# Patient Record
Sex: Male | Born: 1940 | Race: White | Hispanic: No | Marital: Married | Smoking: Never smoker
Health system: Southern US, Community
[De-identification: ages and names within clinical notes are randomized; demographics above are authoritative.]

## PROBLEM LIST (undated history)

## (undated) DIAGNOSIS — E785 Hyperlipidemia, unspecified: Secondary | ICD-10-CM

## (undated) DIAGNOSIS — Q249 Congenital malformation of heart, unspecified: Secondary | ICD-10-CM

## (undated) DIAGNOSIS — C4491 Basal cell carcinoma of skin, unspecified: Secondary | ICD-10-CM

## (undated) DIAGNOSIS — K635 Polyp of colon: Secondary | ICD-10-CM

## (undated) DIAGNOSIS — Z8619 Personal history of other infectious and parasitic diseases: Secondary | ICD-10-CM

## (undated) DIAGNOSIS — R17 Unspecified jaundice: Secondary | ICD-10-CM

## (undated) HISTORY — DX: Congenital malformation of heart, unspecified: Q24.9

## (undated) HISTORY — DX: Unspecified jaundice: R17

## (undated) HISTORY — DX: Basal cell carcinoma of skin, unspecified: C44.91

## (undated) HISTORY — PX: HERNIA REPAIR: SHX51

## (undated) HISTORY — DX: Personal history of other infectious and parasitic diseases: Z86.19

## (undated) HISTORY — PX: TONSILLECTOMY: SUR1361

## (undated) HISTORY — PX: CHOLECYSTECTOMY: SHX55

## (undated) HISTORY — DX: Hyperlipidemia, unspecified: E78.5

## (undated) HISTORY — DX: Polyp of colon: K63.5

## (undated) HISTORY — PX: OTHER SURGICAL HISTORY: SHX169

---

## 2005-05-26 ENCOUNTER — Other Ambulatory Visit: Payer: Self-pay

## 2005-05-26 ENCOUNTER — Emergency Department: Payer: Self-pay | Admitting: Emergency Medicine

## 2005-05-29 ENCOUNTER — Emergency Department (HOSPITAL_COMMUNITY): Admission: EM | Admit: 2005-05-29 | Discharge: 2005-05-29 | Payer: Self-pay | Admitting: Emergency Medicine

## 2005-06-06 ENCOUNTER — Ambulatory Visit: Payer: Self-pay | Admitting: Neurology

## 2005-06-14 ENCOUNTER — Ambulatory Visit: Payer: Self-pay | Admitting: Infectious Diseases

## 2005-06-28 ENCOUNTER — Emergency Department (HOSPITAL_COMMUNITY): Admission: EM | Admit: 2005-06-28 | Discharge: 2005-06-28 | Payer: Self-pay | Admitting: Emergency Medicine

## 2006-06-10 HISTORY — PX: BASAL CELL CARCINOMA EXCISION: SHX1214

## 2008-10-13 ENCOUNTER — Ambulatory Visit: Payer: Self-pay | Admitting: Gastroenterology

## 2011-12-25 ENCOUNTER — Ambulatory Visit (INDEPENDENT_AMBULATORY_CARE_PROVIDER_SITE_OTHER): Payer: BC Managed Care – PPO | Admitting: Family Medicine

## 2011-12-25 ENCOUNTER — Encounter: Payer: Self-pay | Admitting: Family Medicine

## 2011-12-25 VITALS — BP 116/68 | HR 64 | Temp 98.6°F | Resp 16 | Ht 67.5 in | Wt 155.0 lb

## 2011-12-25 DIAGNOSIS — E785 Hyperlipidemia, unspecified: Secondary | ICD-10-CM | POA: Insufficient documentation

## 2011-12-25 DIAGNOSIS — D126 Benign neoplasm of colon, unspecified: Secondary | ICD-10-CM

## 2011-12-25 DIAGNOSIS — K635 Polyp of colon: Secondary | ICD-10-CM | POA: Insufficient documentation

## 2011-12-25 DIAGNOSIS — Z85828 Personal history of other malignant neoplasm of skin: Secondary | ICD-10-CM | POA: Insufficient documentation

## 2011-12-25 NOTE — Progress Notes (Signed)
  Subjective:    Patient ID: Steven Hopkins, male    DOB: 1941/03/20, 71 y.o.   MRN: [id number]  HPI  New patient to establish care. Past medical history reviewed. History of basal cell carcinoma 2002 and 2008. Questionable history of jaundice 1970 of uncertain etiology. He had mild hyperlipidemia not currently treated with any medication. 1 benign colon polyp per colonoscopy 2010. Previous cholecystectomy 1990. Tonsillectomy 1963. He takes several supplements including omega-3 supplement, vitamin D supplement, multivitamin, and aspirin.  Family history significant for mother having colon cancer age 68. Father had lung cancer and history of stroke.  Patient served [place] near [place] which was area of heaviest use with agent orange. He had no known symptoms of agent orange.  Patient is married. Retired from [employer]. Nonsmoker. Occasional alcohol use.   Review of Systems  Constitutional: Negative for appetite change and unexpected weight change.  Respiratory: Negative for cough and shortness of breath.   Cardiovascular: Negative for chest pain.  Gastrointestinal: Negative for abdominal pain.  Genitourinary: Negative for dysuria.  Neurological: Negative for dizziness and headaches.       Objective:   Physical Exam  Constitutional: He is oriented to person, place, and time. He appears well-developed and well-nourished.  HENT:  Right Ear: External ear normal.  Left Ear: External ear normal.  Mouth/Throat: Oropharynx is clear and moist.  Neck: Neck supple. No thyromegaly present.  Cardiovascular: Normal rate and regular rhythm.  Exam reveals no gallop.   Pulmonary/Chest: Effort normal and breath sounds normal. No respiratory distress. He has no wheezes. He has no rales.  Musculoskeletal: He exhibits no edema.  Lymphadenopathy:    He has no cervical adenopathy.  Neurological: He is alert and oriented to person, place, and time. No cranial nerve deficit.          Assessment &  Plan:  #1 history of basal cell skin cancer. No current concerning lesions #2 history of colon polyps. Presumably benign. Old records pending #3 history of mild hyperlipidemia not treated with statin.  Overall low risk for CAD. #4 health maintenance. No record of Pneumovax. Last tetanus unknown. We have suggested complete physical at some point next year

## 2012-01-14 ENCOUNTER — Ambulatory Visit: Payer: BC Managed Care – PPO | Admitting: Family Medicine

## 2012-01-15 ENCOUNTER — Ambulatory Visit (INDEPENDENT_AMBULATORY_CARE_PROVIDER_SITE_OTHER): Payer: BC Managed Care – PPO | Admitting: Family Medicine

## 2012-01-15 ENCOUNTER — Encounter: Payer: Self-pay | Admitting: Family Medicine

## 2012-01-15 VITALS — BP 140/62 | Temp 98.1°F | Wt 157.0 lb

## 2012-01-15 DIAGNOSIS — L570 Actinic keratosis: Secondary | ICD-10-CM | POA: Insufficient documentation

## 2012-01-15 DIAGNOSIS — L218 Other seborrheic dermatitis: Secondary | ICD-10-CM

## 2012-01-15 DIAGNOSIS — L219 Seborrheic dermatitis, unspecified: Secondary | ICD-10-CM

## 2012-01-15 DIAGNOSIS — L989 Disorder of the skin and subcutaneous tissue, unspecified: Secondary | ICD-10-CM

## 2012-01-15 MED ORDER — MOMETASONE FUROATE 0.1 % EX SOLN: Freq: Every day | CUTANEOUS | Status: DC

## 2012-01-15 NOTE — Progress Notes (Signed)
  Subjective:    Patient ID: Steven Hopkins, male    DOB: [birth year], 71 y.o.   MRN: [id number]  HPI  Patient present today to discuss several skin issues. Prior history of basal cell carcinoma and actinic keratosis. Has previously seen dermatologist but they retired. Several issues as follows  Irritated and pruritic scalp. Has tried anti-dandruff shampoos without improvement. Frequent itching, especially in light  Nodular lesion for head. Present about 6 months. Slowly growing in size. He also has nodular growth right temple region. Reportedly had basal cell carcinoma excised in this region previously. He has scaly thickened areas anterior chest and right and left cheek region. Also similar lesions on nose. Uses sun protection.  Past Medical History  Diagnosis Date  . Basal cell carcinoma   . History of chickenpox   . Cardiac arrhythmia due to congenital heart disease   . Jaundice   . Hyperlipidemia   . Colon polyps    Past Surgical History  Procedure Date  . Cholecystectomy   . Tonsillectomy   . Hernia repair   . Basal cell carcinoma excision 2008    left and right temple    reports that he has never smoked. He has never used smokeless tobacco. He reports that he drinks alcohol. He reports that he does not use illicit drugs. family history includes Arthritis in an unspecified family member; Cancer in his father and mother; Depression in an unspecified family member; Diabetes in an unspecified family member; and Stroke in his father. Allergies  Allergen Reactions  . Bactrim (Sulfamethoxazole-Tmp Ds)       Review of Systems  Constitutional: Negative for appetite change and unexpected weight change.  Hematological: Negative for adenopathy.       Objective:   Physical Exam  Constitutional: He appears well-developed and well-nourished.  Cardiovascular: Normal rate and regular rhythm.   Skin:       Scalp somewhat erythematous and slightly dry but nonscaly. No specific  lesions noted. Midforehead he has nodular slightly umbilicated skin lesion with telangiectasias suspicious for probable basal cell carcinoma. Right temporal region reveals slightly nodular slightly  raised lesion site of previous basal cell carcinoma excision  Anterior mid chest reveals slightly erythematous minimally ulcerative area approximately 1/2 cm diameter. On his right and left cheek region as well as nose he has thickened hyperkeratotic areas consistent with probable actinic keratoses          Assessment & Plan:  #1 scalp irritation. Nonspecific- question related to dryness. Elocon lotion once daily #2 probable basal cell carcinoma midforehead. Referral skin surgery Center also question of recurrent basal cell carcinoma right temple #3 nonhealing irritated area anterior chest question early squamous cell carcinoma  #4 hyperkeratotic areas right and left cheek as well as nasal region. 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 Discussed risk and benefits of treatment with liquid nitrogen and patient consented. These areas were treated without difficulty patient tolerated well

## 2012-05-20 ENCOUNTER — Encounter: Payer: Self-pay | Admitting: Family Medicine

## 2012-06-07 ENCOUNTER — Emergency Department (HOSPITAL_COMMUNITY): Payer: BC Managed Care – PPO

## 2012-06-07 ENCOUNTER — Encounter (HOSPITAL_COMMUNITY): Payer: Self-pay

## 2012-06-07 ENCOUNTER — Emergency Department (HOSPITAL_COMMUNITY)
Admission: EM | Admit: 2012-06-07 | Discharge: 2012-06-07 | Disposition: A | Discharge status: Home / Self Care | Payer: BC Managed Care – PPO | Attending: Emergency Medicine | Admitting: Emergency Medicine

## 2012-06-07 DIAGNOSIS — Z8679 Personal history of other diseases of the circulatory system: Secondary | ICD-10-CM | POA: Insufficient documentation

## 2012-06-07 DIAGNOSIS — Z860100 Personal history of colon polyps, unspecified: Secondary | ICD-10-CM | POA: Insufficient documentation

## 2012-06-07 DIAGNOSIS — Z85828 Personal history of other malignant neoplasm of skin: Secondary | ICD-10-CM | POA: Insufficient documentation

## 2012-06-07 DIAGNOSIS — R143 Flatulence: Secondary | ICD-10-CM | POA: Insufficient documentation

## 2012-06-07 DIAGNOSIS — Q249 Congenital malformation of heart, unspecified: Secondary | ICD-10-CM | POA: Insufficient documentation

## 2012-06-07 DIAGNOSIS — H539 Unspecified visual disturbance: Secondary | ICD-10-CM | POA: Insufficient documentation

## 2012-06-07 DIAGNOSIS — Z9089 Acquired absence of other organs: Secondary | ICD-10-CM | POA: Insufficient documentation

## 2012-06-07 DIAGNOSIS — Z8619 Personal history of other infectious and parasitic diseases: Secondary | ICD-10-CM | POA: Insufficient documentation

## 2012-06-07 DIAGNOSIS — E7800 Pure hypercholesterolemia, unspecified: Secondary | ICD-10-CM | POA: Insufficient documentation

## 2012-06-07 DIAGNOSIS — R42 Dizziness and giddiness: Secondary | ICD-10-CM | POA: Insufficient documentation

## 2012-06-07 DIAGNOSIS — R142 Eructation: Secondary | ICD-10-CM | POA: Insufficient documentation

## 2012-06-07 DIAGNOSIS — F411 Generalized anxiety disorder: Secondary | ICD-10-CM | POA: Insufficient documentation

## 2012-06-07 DIAGNOSIS — Z79899 Other long term (current) drug therapy: Secondary | ICD-10-CM | POA: Insufficient documentation

## 2012-06-07 DIAGNOSIS — R141 Gas pain: Secondary | ICD-10-CM | POA: Insufficient documentation

## 2012-06-07 DIAGNOSIS — Z8719 Personal history of other diseases of the digestive system: Secondary | ICD-10-CM | POA: Insufficient documentation

## 2012-06-07 DIAGNOSIS — R1013 Epigastric pain: Secondary | ICD-10-CM

## 2012-06-07 DIAGNOSIS — Z7982 Long term (current) use of aspirin: Secondary | ICD-10-CM | POA: Insufficient documentation

## 2012-06-07 LAB — CBC WITH DIFFERENTIAL/PLATELET
Basophils Absolute: 0 K/uL (ref 0.0–0.1)
Basophils Relative: 1 % (ref 0–1)
Eosinophils Absolute: 0 10*3/uL (ref 0.0–0.7)
Eosinophils Relative: 1 % (ref 0–5)
HCT: 45.3 % (ref 39.0–52.0)
Hemoglobin: 15.9 g/dL (ref 13.0–17.0)
Lymphocytes Relative: 20 % (ref 12–46)
Lymphs Abs: 1.1 10*3/uL (ref 0.7–4.0)
MCH: 31.4 pg (ref 26.0–34.0)
MCHC: 35.1 g/dL (ref 30.0–36.0)
MCV: 89.3 fL (ref 78.0–100.0)
Monocytes Absolute: 0.5 K/uL (ref 0.1–1.0)
Monocytes Relative: 9 % (ref 3–12)
Neutro Abs: 3.9 10*3/uL (ref 1.7–7.7)
Neutrophils Relative %: 71 % (ref 43–77)
Platelets: 211 10*3/uL (ref 150–400)
RBC: 5.07 MIL/uL (ref 4.22–5.81)
RDW: 13.1 % (ref 11.5–15.5)
WBC: 5.6 K/uL (ref 4.0–10.5)

## 2012-06-07 LAB — COMPREHENSIVE METABOLIC PANEL
ALT: 13 U/L (ref 0–53)
AST: 17 U/L (ref 0–37)
Alkaline Phosphatase: 55 U/L (ref 39–117)
CO2: 28 mEq/L (ref 19–32)
Calcium: 9.4 mg/dL (ref 8.4–10.5)
Potassium: 3.9 mEq/L (ref 3.5–5.1)
Sodium: 140 mEq/L (ref 135–145)
Total Protein: 6.3 g/dL (ref 6.0–8.3)

## 2012-06-07 LAB — COMPREHENSIVE METABOLIC PANEL WITH GFR
Albumin: 3.6 g/dL (ref 3.5–5.2)
BUN: 14 mg/dL (ref 6–23)
Chloride: 104 meq/L (ref 96–112)
Creatinine, Ser: 1.01 mg/dL (ref 0.50–1.35)
GFR calc Af Amer: 86 mL/min — ABNORMAL LOW (ref >90)
GFR calc non Af Amer: 74 mL/min — ABNORMAL LOW (ref >90)
Glucose, Bld: 97 mg/dL (ref 70–99)
Total Bilirubin: 0.5 mg/dL (ref 0.3–1.2)

## 2012-06-07 LAB — TROPONIN I
Troponin I: <0.3 ng/mL (ref <0.30)
Troponin I: <0.3 ng/mL (ref <0.30)

## 2012-06-07 LAB — LIPASE, BLOOD: Lipase: 34 U/L (ref 11–59)

## 2012-06-07 NOTE — ED Notes (Signed)
Pt reports he has a hernia and has had increased bloating recently.  Pt states he has had increasing weakness.  Pt states he feels palpitations at times and reports a hx of cardiac arrythmia.  Pt got on his elliptical machine today and was on it for 5 min and began seeing flashes of light in his peripheral vision.  Pt states he felt very weak and laid down.  EMS called.

## 2012-06-07 NOTE — Discharge Instructions (Signed)
Abdominal Pain  Abdominal pain can be caused by many things. Your caregiver decides the seriousness of your pain by an examination and possibly blood tests and X-rays. Many cases can be observed and treated at home. Most abdominal pain is not caused by a disease and will probably improve without treatment. However, in many cases, more time must pass before a clear cause of the pain can be found. Before that point, it may not be known if you need more testing, or if hospitalization or surgery is needed.  HOME CARE INSTRUCTIONS   · Do not take laxatives unless directed by your caregiver.  · Take pain medicine only as directed by your caregiver.  · Only take over-the-counter or prescription medicines for pain, discomfort, or fever as directed by your caregiver.  · Try a clear liquid diet (broth, tea, or water) for as long as directed by your caregiver. Slowly move to a bland diet as tolerated.  SEEK IMMEDIATE MEDICAL CARE IF:   · The pain does not go away.  · You have a fever.  · You keep throwing up (vomiting).  · The pain is felt only in portions of the abdomen. Pain in the right side could possibly be appendicitis. In an adult, pain in the left lower portion of the abdomen could be colitis or diverticulitis.  · You pass bloody or black tarry stools.  MAKE SURE YOU:   · Understand these instructions.  · Will watch your condition.  · Will get help right away if you are not doing well or get worse.  Document Released: 03/06/2005 Document Revised: 08/19/2011 Document Reviewed: 01/13/2008  ExitCare® Patient Information ©2013 [employer].

## 2012-06-07 NOTE — ED Provider Notes (Signed)
History     CSN: [id number]  Arrival date & time 06/07/12  1126   First MD Initiated Contact with Patient 06/07/12 1131      Chief Complaint  Patient presents with  . Weakness    (Consider location/radiation/quality/duration/timing/severity/associated sxs/prior treatment) HPI Comments: Patient reports this morning he had gotten knocked to spend some time exercising on his elliptical machine which he does time to time. His last session was about 2 or 3 days ago that he walked for about 15 minutes. This morning he was only able to exercise for a few minutes and began to have a bloating sensation and discomfort across his upper epigastrium. He stopped and went to the home office where he sat with his wife did do a little bit of office work and rest. Subsequently he developed an episode of feeling lightheaded with bright lights flashing in his peripheral vision. He denied headache at that time. He denies syncope. He denies unusual diaphoresis, nausea or vomiting. He does have a history of a hiatal hernia. He also had a umbilical hernia with history of surgical hernia repair. He has a history of vitreous detachment and call his ophthalmologist do to the flashing lights and was told that this sounded somewhat like an ocular migraine and was told to rest. The patient tried to rest but reports that he began to feel worse and thus EMS was called. He admits that he was feeling somewhat anxious. He also has had a history of intermittent palpitations that has never been fully diagnosed. He reports he's had some increase in the frequency of the arrhythmias but no frank chest pain or tightness or discomfort. He reports no former smoking history, no significant family history of cardiac disease. He has never had a stress test in the past. Currently he is on no medications. He used to be on medication for high cholesterol but took himself off a couple of years ago. Otherwise at this time he takes supplements,  eyedrops. He does have a remote history of basal cell carcinoma of his face that was removed about 5 years ago.  Patient is a 71 y.o. male presenting with weakness. The history is provided by the patient, the EMS personnel and medical records.  Weakness The primary symptoms include dizziness. Primary symptoms do not include headaches, fever, nausea or vomiting.  Dizziness also occurs with weakness. Dizziness does not occur with nausea or vomiting.   Additional symptoms include weakness. Additional symptoms do not include photophobia.    Past Medical History  Diagnosis Date  . Basal cell carcinoma   . History of chickenpox   . Cardiac arrhythmia due to congenital heart disease   . Jaundice   . Hyperlipidemia   . Colon polyps     Past Surgical History  Procedure Date  . Cholecystectomy   . Tonsillectomy   . Hernia repair   . Basal cell carcinoma excision 2008    left and right temple  . Detached vitreous     Family History  Problem Relation Age of Onset  . Cancer Mother   . Cancer Father     lung  . Stroke Father   . Depression    . Arthritis    . Diabetes      History  Substance Use Topics  . Smoking status: Never Smoker   . Smokeless tobacco: Never Used  . Alcohol Use: Yes      Review of Systems  Constitutional: Negative for fever and chills.  Eyes: Positive  for visual disturbance. Negative for photophobia and redness.  Respiratory: Negative for chest tightness and shortness of breath.   Cardiovascular: Negative for chest pain.  Gastrointestinal: Positive for abdominal pain and abdominal distention. Negative for nausea, vomiting, diarrhea and constipation.  Musculoskeletal: Negative for back pain.  Skin: Negative for rash.  Neurological: Positive for dizziness, weakness and light-headedness. Negative for syncope and headaches.    Allergies  Bactrim  Home Medications   Current Outpatient Rx  Name  Route  Sig  Dispense  Refill  . ASPIRIN 325 MG PO  TABS   Oral   Take 325 mg by mouth daily.         . B COMPLEX-C PO TABS   Oral   Take 1 tablet by mouth daily.         Marland Kitchen VITAMIN D3 1000 UNITS PO CAPS   Oral   Take 1 capsule by mouth 2 (two) times daily.          . COQ10 200 MG PO CAPS   Oral   Take 200 mg by mouth daily.         . OMEGA-3 FATTY ACIDS 1000 MG PO CAPS   Oral   Take 1 g by mouth.          Marland Kitchen GLUCOSAMINE-CHONDROITIN 500-400 MG PO TABS   Oral   Take 1 tablet by mouth daily.          Marland Kitchen ONE-DAILY MULTI VITAMINS PO TABS   Oral   Take 1 tablet by mouth daily.           BP 121/75  Pulse 87  Temp 98.2 F (36.8 C) (Oral)  Resp 16  SpO2 96%  Physical Exam  Nursing note and vitals reviewed. Constitutional: He is oriented to person, place, and time. He appears well-developed and well-nourished.  Non-toxic appearance. He does not have a sickly appearance. He does not appear ill. No distress.  HENT:  Head: Normocephalic and atraumatic.  Eyes: EOM are normal. Pupils are equal, round, and reactive to light. Right eye exhibits no discharge. Left eye exhibits no discharge. No scleral icterus.  Neck: Normal range of motion. Neck supple. No JVD present.  Cardiovascular: Normal rate and regular rhythm.   No murmur heard. Pulmonary/Chest: Effort normal and breath sounds normal. No respiratory distress. He has no wheezes.  Abdominal: Soft. He exhibits no distension. There is no tenderness. There is no rebound and no guarding.  Musculoskeletal: Normal range of motion. He exhibits no edema and no tenderness.  Neurological: He is alert and oriented to person, place, and time. He has normal strength. No cranial nerve deficit or sensory deficit. Coordination normal. GCS eye subscore is 4. GCS verbal subscore is 5. GCS motor subscore is 6.  Skin: Skin is warm. No rash noted. No erythema.  Psychiatric: He has a normal mood and affect.    ED Course  Procedures (including critical care time)  Labs Reviewed    COMPREHENSIVE METABOLIC PANEL - Abnormal; Notable for the following:    GFR calc non Af Amer 74 (*)     GFR calc Af Amer 86 (*)     All other components within normal limits  CBC WITH DIFFERENTIAL  TROPONIN I  LIPASE, BLOOD  TROPONIN I   Dg Chest Port 1 View  06/07/2012  *RADIOLOGY REPORT*  Clinical Data: Chest pain, chest discomfort  PORTABLE CHEST - 1 VIEW  Comparison: None.  Findings: Normal mediastinum and cardiac silhouette.  Normal pulmonary  vasculature.  No evidence of effusion, infiltrate, or pneumothorax.  No acute bony abnormality.  IMPRESSION: No acute cardiopulmonary process.   Original Report Authenticated By: Genevive Bi, M.D.      1. Epigastric pain     Room air saturation is 97% and I interpret this to be normal.  ECG performed at time 11:31, shows normal sinus rhythm at a rate of 68. Borderline left axis deviation is noted. Otherwise normal intervals, no ST or T-wave abnormalities. No EKG is available for comparison. This is a nonischemic EKG with no arrhythmias.  3:48 PM Pt has remained symptom free.  Second troponin is normal.  Will let Dr. Caryl Never know of symptoms and need for follow up.    3:58 PM Spoke to Dr. Elliot Gurney who will let Dr. Caryl Never know.  Pt to call office tomorrow.  I have sent notice to Dr. Caryl Never through [facility].  MDM  Patient is currently well appearing with stable vital signs and symptoms seemed to have fully resolved at this time. Given his symptoms seemed exertional in nature, will obtain a cardiac workup with serial troponins. Will also watch the patient here on telemetry and screen for recurrence of symptoms. Patient has no significant history with only cardiac risk factor being age and remote history of high cholesterol. Symptoms do not seem consistent with a ophthalmologic issue nor CVA.        Gavin Pound. Marnae Madani, MD 06/07/12 4540

## 2012-06-09 ENCOUNTER — Ambulatory Visit (INDEPENDENT_AMBULATORY_CARE_PROVIDER_SITE_OTHER): Payer: BC Managed Care – PPO | Admitting: Family Medicine

## 2012-06-09 ENCOUNTER — Encounter: Payer: Self-pay | Admitting: Family Medicine

## 2012-06-09 VITALS — BP 122/54 | HR 89 | Temp 98.1°F | Wt 161.0 lb

## 2012-06-09 DIAGNOSIS — F438 Other reactions to severe stress: Secondary | ICD-10-CM

## 2012-06-09 DIAGNOSIS — F430 Acute stress reaction: Secondary | ICD-10-CM

## 2012-06-09 DIAGNOSIS — R1013 Epigastric pain: Secondary | ICD-10-CM

## 2012-06-09 NOTE — Patient Instructions (Addendum)
Stress Stress-related medical problems are becoming increasingly common. The body has a built-in physical response to stressful situations. Faced with pressure, challenge or danger, we need to react quickly. Our bodies release hormones such as cortisol and adrenaline to help do this. These hormones are part of the "fight or flight" response and affect the metabolic rate, heart rate and blood pressure, resulting in a heightened, stressed state that prepares the body for optimum performance in dealing with a stressful situation. It is likely that early man required these mechanisms to stay alive, but usually modern stresses do not call for this, and the same hormones released in today's world can damage health and reduce coping ability. CAUSES  Pressure to perform at work, at school or in sports.  Threats of physical violence.  Money worries.  Arguments.  Family conflicts.  Divorce or separation from significant other.  Bereavement.  New job or unemployment.  Changes in location.  Alcohol or drug abuse. SOMETIMES, THERE IS NO PARTICULAR REASON FOR DEVELOPING STRESS. Almost all people are at risk of being stressed at some time in their lives. It is important to know that some stress is temporary and some is long term.  Temporary stress will go away when a situation is resolved. Most people can cope with short periods of stress, and it can often be relieved by relaxing, taking a walk, chatting through issues with friends, or having a good night's sleep.  Chronic (long-term, continuous) stress is much harder to deal with. It can be psychologically and emotionally damaging. It can be harmful both for an individual and for friends and family. SYMPTOMS Everyone reacts to stress differently. There are some common effects that help us recognize it. In times of extreme stress, people may:  Shake uncontrollably.  Breathe faster and deeper than normal (hyperventilate).  Vomit.  For people  with asthma, stress can trigger an attack.  For some people, stress may trigger migraine headaches, ulcers, and body pain. PHYSICAL EFFECTS OF STRESS MAY INCLUDE:  Loss of energy.  Skin problems.  Aches and pains resulting from tense muscles, including neck ache, backache and tension headaches.  Increased pain from arthritis and other conditions.  Irregular heart beat (palpitations).  Periods of irritability or anger.  Apathy or depression.  Anxiety (feeling uptight or worrying).  Unusual behavior.  Loss of appetite.  Comfort eating.  Lack of concentration.  Loss of, or decreased, sex-drive.  Increased smoking, drinking, or recreational drug use.  For women, missed periods.  Ulcers, joint pain, and muscle pain. Post-traumatic stress is the stress caused by any serious accident, strong emotional damage, or extremely difficult or violent experience such as rape or war. Post-traumatic stress victims can experience mixtures of emotions such as fear, shame, depression, guilt or anger. It may include recurrent memories or images that may be haunting. These feelings can last for weeks, months or even years after the traumatic event that triggered them. Specialized treatment, possibly with medicines and psychological therapies, is available. If stress is causing physical symptoms, severe distress or making it difficult for you to function as normal, it is worth seeing your caregiver. It is important to remember that although stress is a usual part of life, extreme or prolonged stress can lead to other illnesses that will need treatment. It is better to visit a doctor sooner rather than later. Stress has been linked to the development of high blood pressure and heart disease, as well as insomnia and depression. There is no diagnostic test for   stress since everyone reacts to it differently. But a caregiver will be able to spot the physical symptoms, such  as:  Headaches.  Shingles.  Ulcers. Emotional distress such as intense worry, low mood or irritability should be detected when the doctor asks pertinent questions to identify any underlying problems that might be the cause. In case there are physical reasons for the symptoms, the doctor may also want to do some tests to exclude certain conditions. If you feel that you are suffering from stress, try to identify the aspects of your life that are causing it. Sometimes you may not be able to change or avoid them, but even a small change can have a positive ripple effect. A simple lifestyle change can make all the difference. STRATEGIES THAT CAN HELP DEAL WITH STRESS:  Delegating or sharing responsibilities.  Avoiding confrontations.  Learning to be more assertive.  Regular exercise.  Avoid using alcohol or street drugs to cope.  Eating a healthy, balanced diet, rich in fruit and vegetables and proteins.  Finding humor or absurdity in stressful situations.  Never taking on more than you know you can handle comfortably.  Organizing your time better to get as much done as possible.  Talking to friends or family and sharing your thoughts and fears.  Listening to music or relaxation tapes.  Tensing and then relaxing your muscles, starting at the toes and working up to the head and neck. If you think that you would benefit from help, either in identifying the things that are causing your stress or in learning techniques to help you relax, see a caregiver who is capable of helping you with this. Rather than relying on medications, it is usually better to try and identify the things in your life that are causing stress and try to deal with them. There are many techniques of managing stress including counseling, psychotherapy, aromatherapy, yoga, and exercise. Your caregiver can help you determine what is best for you. Document Released: 08/17/2002 Document Revised: 08/19/2011 Document  Reviewed: 07/14/2007 Exit[employer] Patient Information 2013 [employer].  

## 2012-06-09 NOTE — Progress Notes (Signed)
Subjective:    Patient ID: Steven Hopkins, male    DOB: [birth year], 71 y.o.   MRN: [id number]  HPI Emergency room followup. Notes reviewed. Patient had episode Sunday morning about 30 minutes after eating breakfast of epigastric bloated feeling after about 3-4 minutes on the elliptical. He does not recall getting heart rate up much. Several minutes after exercise while putting at computer system together he noted a bloated feeling epigastric region along with mild palpitations. No chest pains. No diaphoresis. He also subsequently developed bilateral peripheral vision blurriness which lasted only about 10-15 minutes. No headache. Previous history of vitreous detachment. He called optometrist who suggested that he may have had ocular migraine. He's had no symptoms whatsoever since then. He exercises fairly frequently and has never had any exertional chest pain or dyspnea.  Patient went to emergency department. EKG and chest x-ray unremarkable. Troponins negative. Patient feels that this is a stress reaction. He frequently feels out of control with world events. He tends to obsess about things that he cannot control. Generally sleeping well. Has already started some things to reduce stress such as yoga and plans to do more this. He also has done some massage therapy which helps. Generally sleeping fairly well. Denies depressive symptoms.  Past Medical History  Diagnosis Date  . Basal cell carcinoma   . History of chickenpox   . Cardiac arrhythmia due to congenital heart disease   . Jaundice   . Hyperlipidemia   . Colon polyps    Past Surgical History  Procedure Date  . Cholecystectomy   . Tonsillectomy   . Hernia repair   . Basal cell carcinoma excision 2008    left and right temple  . Detached vitreous     reports that he has never smoked. He has never used smokeless tobacco. He reports that he drinks alcohol. He reports that he does not use illicit drugs. family history includes  Arthritis in an unspecified family member; Cancer in his father and mother; Depression in an unspecified family member; Diabetes in an unspecified family member; and Stroke in his father. Allergies  Allergen Reactions  . Bactrim (Sulfamethoxazole-Tmp Ds) Rash      Review of Systems  Constitutional: Negative for appetite change and fatigue.  HENT: Negative for ear pain, congestion and trouble swallowing.   Respiratory: Negative for cough, shortness of breath and wheezing.   Cardiovascular: Negative for chest pain and palpitations.  Gastrointestinal: Negative for nausea, vomiting, abdominal pain, diarrhea, constipation, blood in stool, abdominal distention and rectal pain.  Musculoskeletal: Negative for joint swelling and arthralgias.  Skin: Negative for rash.  Neurological: Negative for dizziness, syncope and headaches.  Hematological: Negative for adenopathy.  Psychiatric/Behavioral: Negative for confusion and dysphoric mood.       Objective:   Physical Exam  Constitutional: He is oriented to person, place, and time. He appears well-developed and well-nourished.  HENT:  Right Ear: External ear normal.  Left Ear: External ear normal.  Mouth/Throat: Oropharynx is clear and moist.  Neck: Neck supple. No thyromegaly present.  Cardiovascular: Normal rate and regular rhythm.   No murmur heard. Pulmonary/Chest: Effort normal and breath sounds normal. No respiratory distress. He has no wheezes. He has no rales.  Abdominal: Soft. Bowel sounds are normal. He exhibits no distension and no mass. There is no tenderness. There is no rebound and no guarding.  Musculoskeletal: He exhibits no edema.  Lymphadenopathy:    He has no cervical adenopathy.  Neurological: He is alert and oriented  to person, place, and time.  Psychiatric: He has a normal mood and affect. His behavior is normal. Judgment and thought content normal.          Assessment & Plan:  Patient presents with recent episode  of vague abdominal bloated feeling. He's never had exertional chest pain. No exertional dyspnea. Recent evaluation negative. We discussed further testing such as stress testing at this point is not interested. We do feel from history this is likely stress related. No evidence for depression. We talked about measures to reduce stress such as exercise, adequate sleep, good nutrition, yoga. Offered counseling and he is not interested.

## 2012-08-03 ENCOUNTER — Ambulatory Visit (INDEPENDENT_AMBULATORY_CARE_PROVIDER_SITE_OTHER): Payer: BC Managed Care – PPO | Admitting: Family Medicine

## 2012-08-03 ENCOUNTER — Ambulatory Visit (INDEPENDENT_AMBULATORY_CARE_PROVIDER_SITE_OTHER)
Admission: RE | Admit: 2012-08-03 | Discharge: 2012-08-03 | Disposition: A | Discharge status: Home / Self Care | Payer: BC Managed Care – PPO | Source: Ambulatory Visit | Attending: Family Medicine | Admitting: Family Medicine

## 2012-08-03 ENCOUNTER — Encounter: Payer: Self-pay | Admitting: Family Medicine

## 2012-08-03 VITALS — BP 130/70 | Temp 98.0°F | Wt 158.0 lb

## 2012-08-03 DIAGNOSIS — M79671 Pain in right foot: Secondary | ICD-10-CM

## 2012-08-03 DIAGNOSIS — M79609 Pain in unspecified limb: Secondary | ICD-10-CM

## 2012-08-03 NOTE — Progress Notes (Signed)
  Subjective:    Patient ID: Steven Hopkins, male    DOB: 1940-07-21, 72 y.o.   MRN: [id number]  HPI  Acute visit  Right foot pain.  Onset Nov 2013.  Only with walking. Sharp pain and somewhat progressive over months. No erythema or warmth.  No injury and no hx ecchymosis. No RLE radiculopathy.   No alleviating other than at rest.    No recent change of shoe wear    Review of Systems  Musculoskeletal: Negative for back pain, joint swelling and gait problem.  Skin: Negative for rash.       Objective:   Physical Exam  Constitutional: He appears well-developed and well-nourished.  Cardiovascular: Normal rate and regular rhythm.   Musculoskeletal:  Right foot reveals no ecchymosis. No erythema. No warmth. No edema. No bony tenderness. Full range of motion all digits. He has very high arches. No interdigital web space tenderness  2+ posterior tibial and dorsalis pedis pulses right foot  Neurological:  Normal sensory function to touch right foot          Assessment & Plan:  Right foot pain. Doubt bony injury. No suspicion for stress fracture. He does not have evidence for Morton's neuroma. No evidence for metatarsalgia. We discussed other possibilities such as neuropathic pain but his pain is not at rest and only with walking which suggests musculoskeletal. Given duration start with x-ray. If normal consider orthotics or a least in-sole supports

## 2012-08-04 NOTE — Progress Notes (Signed)
Quick Note:  Pt informed ______ 

## 2012-10-21 ENCOUNTER — Encounter: Payer: Self-pay | Admitting: Family Medicine

## 2012-10-21 ENCOUNTER — Ambulatory Visit (INDEPENDENT_AMBULATORY_CARE_PROVIDER_SITE_OTHER): Payer: BC Managed Care – PPO | Admitting: Family Medicine

## 2012-10-21 VITALS — BP 130/70 | Temp 98.2°F | Wt 154.0 lb

## 2012-10-21 DIAGNOSIS — R1030 Lower abdominal pain, unspecified: Secondary | ICD-10-CM

## 2012-10-21 DIAGNOSIS — K4090 Unilateral inguinal hernia, without obstruction or gangrene, not specified as recurrent: Secondary | ICD-10-CM

## 2012-10-21 DIAGNOSIS — R208 Other disturbances of skin sensation: Secondary | ICD-10-CM

## 2012-10-21 DIAGNOSIS — R109 Unspecified abdominal pain: Secondary | ICD-10-CM

## 2012-10-21 DIAGNOSIS — R209 Unspecified disturbances of skin sensation: Secondary | ICD-10-CM

## 2012-10-21 LAB — HEPATIC FUNCTION PANEL
Albumin: 4.1 g/dL (ref 3.5–5.2)
Alkaline Phosphatase: 55 U/L (ref 39–117)
Total Protein: 6.3 g/dL (ref 6.0–8.3)

## 2012-10-21 LAB — POCT URINALYSIS DIPSTICK
Glucose, UA: NEGATIVE
Nitrite, UA: NEGATIVE
Urobilinogen, UA: 0.2

## 2012-10-21 LAB — CBC WITH DIFFERENTIAL/PLATELET
Basophils Relative: 0.6 % (ref 0.0–3.0)
Eosinophils Absolute: 0 10*3/uL (ref 0.0–0.7)
HCT: 50.3 % (ref 39.0–52.0)
Hemoglobin: 16.9 g/dL (ref 13.0–17.0)
Lymphocytes Relative: 30.1 % (ref 12.0–46.0)
Lymphs Abs: 2 10*3/uL (ref 0.7–4.0)
MCHC: 33.7 g/dL (ref 30.0–36.0)
MCV: 92.1 fl (ref 78.0–100.0)
Monocytes Absolute: 0.7 10*3/uL (ref 0.1–1.0)
Neutro Abs: 3.8 10*3/uL (ref 1.4–7.7)
RBC: 5.46 Mil/uL (ref 4.22–5.81)

## 2012-10-21 LAB — VITAMIN B12: Vitamin B-12: 828 pg/mL (ref 211–911)

## 2012-10-21 LAB — BASIC METABOLIC PANEL
BUN: 14 mg/dL (ref 6–23)
CO2: 29 mEq/L (ref 19–32)
Glucose, Bld: 81 mg/dL (ref 70–99)
Potassium: 4.2 mEq/L (ref 3.5–5.1)
Sodium: 140 mEq/L (ref 135–145)

## 2012-10-21 NOTE — Progress Notes (Signed)
Subjective:    Patient ID: Steven Hopkins, male    DOB: 06/06/1941, 72 y.o.   MRN: [id number]  HPI Poorly localized discomfort for about 3 weeks bilateral inguinal area. Past hx bil inguinal hernia repair. Has noted some recent bulge left inguinal area but not clear this is causing his pain No dysuria.  No hematuria.  No stool changes Some mild suprapubic discomfort. No nausea or vomiting.  Colonoscopy about 4 years ago. Appetite and weight stable. Denies any fever, chills, or night sweats.  Patient also complains of some diffuse intermittent burning dysesthesias intermittently upper and lower extremities. These are very symmetric. No exacerbating or alleviating factors.  Past Medical History  Diagnosis Date  . Basal cell carcinoma   . History of chickenpox   . Cardiac arrhythmia due to congenital heart disease   . Jaundice   . Hyperlipidemia   . Colon polyps    Past Surgical History  Procedure Laterality Date  . Cholecystectomy    . Tonsillectomy    . Hernia repair    . Basal cell carcinoma excision  2008    left and right temple  . Detached vitreous      reports that he has never smoked. He has never used smokeless tobacco. He reports that  drinks alcohol. He reports that he does not use illicit drugs. family history includes Arthritis in an unspecified family member; Cancer in his father and mother; Depression in an unspecified family member; Diabetes in an unspecified family member; and Stroke in his father. Allergies  Allergen Reactions  . Bactrim (Sulfamethoxazole-Tmp Ds) Rash      Review of Systems  Constitutional: Negative for fever, chills, appetite change, fatigue and unexpected weight change.  Respiratory: Negative for cough and shortness of breath.   Cardiovascular: Negative for chest pain.  Gastrointestinal: Negative for nausea, vomiting, diarrhea, blood in stool and abdominal distention.  Endocrine: Negative for polydipsia and polyuria.  Genitourinary:  Negative for dysuria.  Skin: Negative for rash.  Neurological: Negative for weakness and headaches.  Hematological: Negative for adenopathy.       Objective:   Physical Exam  Constitutional: He is oriented to person, place, and time. He appears well-developed and well-nourished.  HENT:  Right Ear: External ear normal.  Left Ear: External ear normal.  Mouth/Throat: Oropharynx is clear and moist.  Neck: Neck supple. No thyromegaly present.  Cardiovascular: Normal rate and regular rhythm.   Pulmonary/Chest: Effort normal and breath sounds normal. No respiratory distress. He has no wheezes. He has no rales.  Abdominal: Soft. Bowel sounds are normal. He exhibits no distension. There is no tenderness. There is no rebound and no guarding.  Genitourinary: Rectum normal and prostate normal.  Patient has evidence for recurrent left inguinal hernia. Soft and nontender. No inguinal adenopathy  Musculoskeletal: He exhibits no edema.  Neurological: He is alert and oriented to person, place, and time. He has normal reflexes. No cranial nerve deficit. Coordination normal.  Skin: No rash noted.          Assessment & Plan:  Patient presents with vague bilateral inguinal and suprapubic discomfort for about one month. He has evidence for recurrent left inguinal hernia but this is nontender and not clearly related to his discomfort. He has not any red flags such as appetite or weight change or any fever or stool change. Check labs with CBC, basic metabolic panel, PSA, urinalysis. Consider surgical consult regarding recurrent left inguinal hernia but he wishes to wait at this time  Vague dysesthesias. Check B12 and TSH. No specific risk factors for B12 deficiency other than age

## 2012-10-22 NOTE — Progress Notes (Signed)
Quick Note:  Pt wife informed ______ 

## 2013-07-19 ENCOUNTER — Encounter: Payer: Self-pay | Admitting: Family Medicine

## 2013-07-19 ENCOUNTER — Ambulatory Visit (INDEPENDENT_AMBULATORY_CARE_PROVIDER_SITE_OTHER): Payer: BC Managed Care – PPO | Admitting: Family Medicine

## 2013-07-19 VITALS — BP 130/68 | HR 95 | Temp 97.5°F | Wt 160.0 lb

## 2013-07-19 DIAGNOSIS — R21 Rash and other nonspecific skin eruption: Secondary | ICD-10-CM

## 2013-07-19 MED ORDER — METHYLPREDNISOLONE ACETATE 80 MG/ML IJ SUSP
80.0000 mg | Freq: Once | INTRAMUSCULAR | Status: AC
  Administered 2013-07-19: 80 mg via INTRAMUSCULAR

## 2013-07-19 NOTE — Progress Notes (Signed)
   Subjective:    Patient ID: Steven Hopkins, male    DOB: 1941-01-23, 73 y.o.   MRN: [id number]  Rash Pertinent negatives include no fever.   Patient is seen with four-day history of rash involving his forehead Last Thursday he was using some Drano and got some on his hands. He washed his hands promptly. By that night he noticed some burning and itching on his for head bilaterally. At this point he has no burning and just itching. He's tried topical over-the-counter steroid and Neosporin without improvement. No eye involvement. No other skin rash noted. Exacerbated by heat.  Past Medical History  Diagnosis Date  . Basal cell carcinoma   . History of chickenpox   . Cardiac arrhythmia due to congenital heart disease   . Jaundice   . Hyperlipidemia   . Colon polyps    Past Surgical History  Procedure Laterality Date  . Cholecystectomy    . Tonsillectomy    . Hernia repair    . Basal cell carcinoma excision  2008    left and right temple  . Detached vitreous      reports that he has never smoked. He has never used smokeless tobacco. He reports that he drinks alcohol. He reports that he does not use illicit drugs. family history includes Arthritis in an other family member; Cancer in his father and mother; Depression in an other family member; Diabetes in an other family member; Stroke in his father. Allergies  Allergen Reactions  . Bactrim [Sulfamethoxazole-Tmp Ds] Rash     Review of Systems  Constitutional: Negative for fever and chills.  Eyes: Negative for visual disturbance.  Skin: Positive for rash.  Neurological: Negative for headaches.       Objective:   Physical Exam  Constitutional: He appears well-developed and well-nourished.  Cardiovascular: Normal rate.   Pulmonary/Chest: Effort normal and breath sounds normal. No respiratory distress. He has no wheezes. He has no rales.  Skin: Rash noted.  Patient has some nonspecific erythema diffusely across the  forehead. This crosses the midline. No vesicles. No pustules. Nontender. Not warm to touch. He has a very small patch of erythema just below the left eyelid region          Assessment & Plan:   facial skin rash. Suspect contact type dermatitis. Patient has had previous intolerance with oral steroids. Trial of Depo-Medrol 80 mg IM. Continue over-the-counter topical steroid and antihistamine as needed.

## 2013-07-19 NOTE — Progress Notes (Signed)
Pre visit review using our clinic review tool, if applicable. No additional management support is needed unless otherwise documented below in the visit note. 

## 2013-07-19 NOTE — Addendum Note (Signed)
Addended by: Noe Gens E on: 07/19/2013 11:46 AM   Modules accepted: Orders

## 2013-11-20 IMAGING — CR DG FOOT COMPLETE 3+V*R*
3 series · 3 of 3 positions shown · non-contrast
Comparison: None.

CLINICAL DATA: Progressive right foot pain for 3 months.  No
injury.

RIGHT FOOT COMPLETE - 3+ VIEW

[view not recorded (1 of 3)]
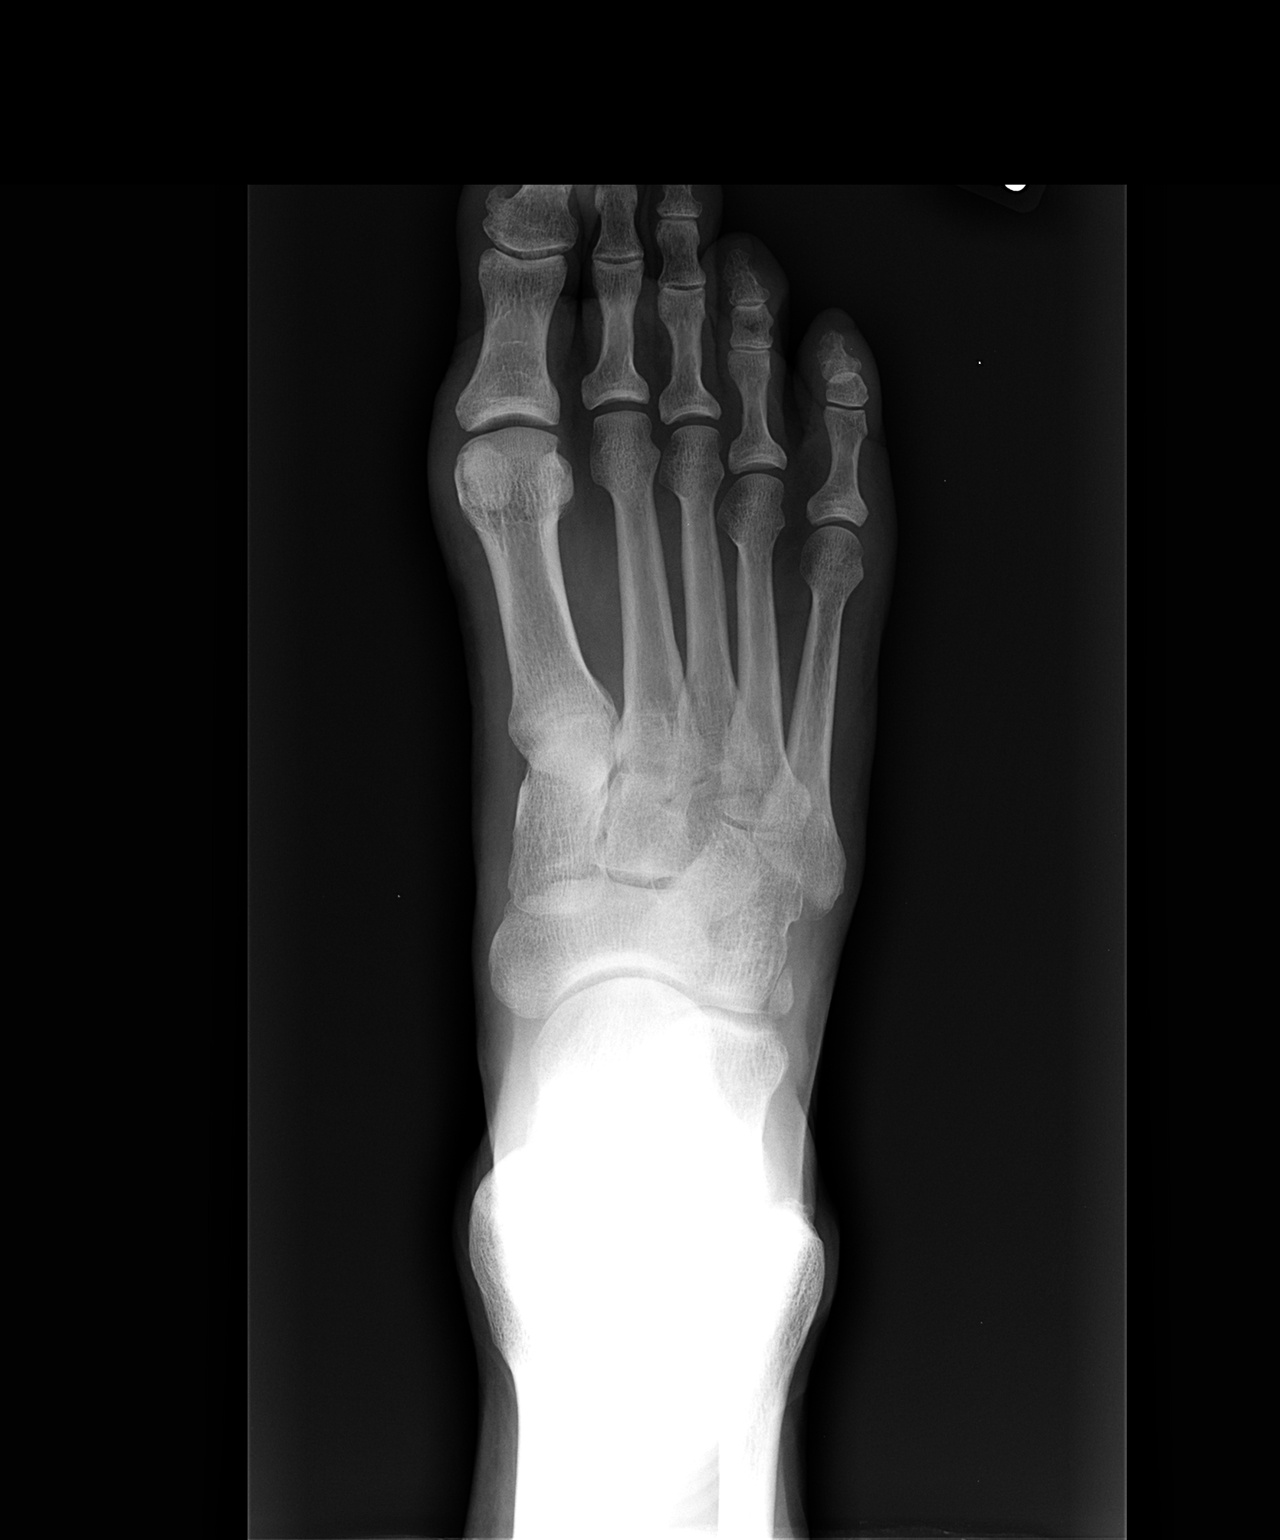

[view not recorded (2 of 3)]
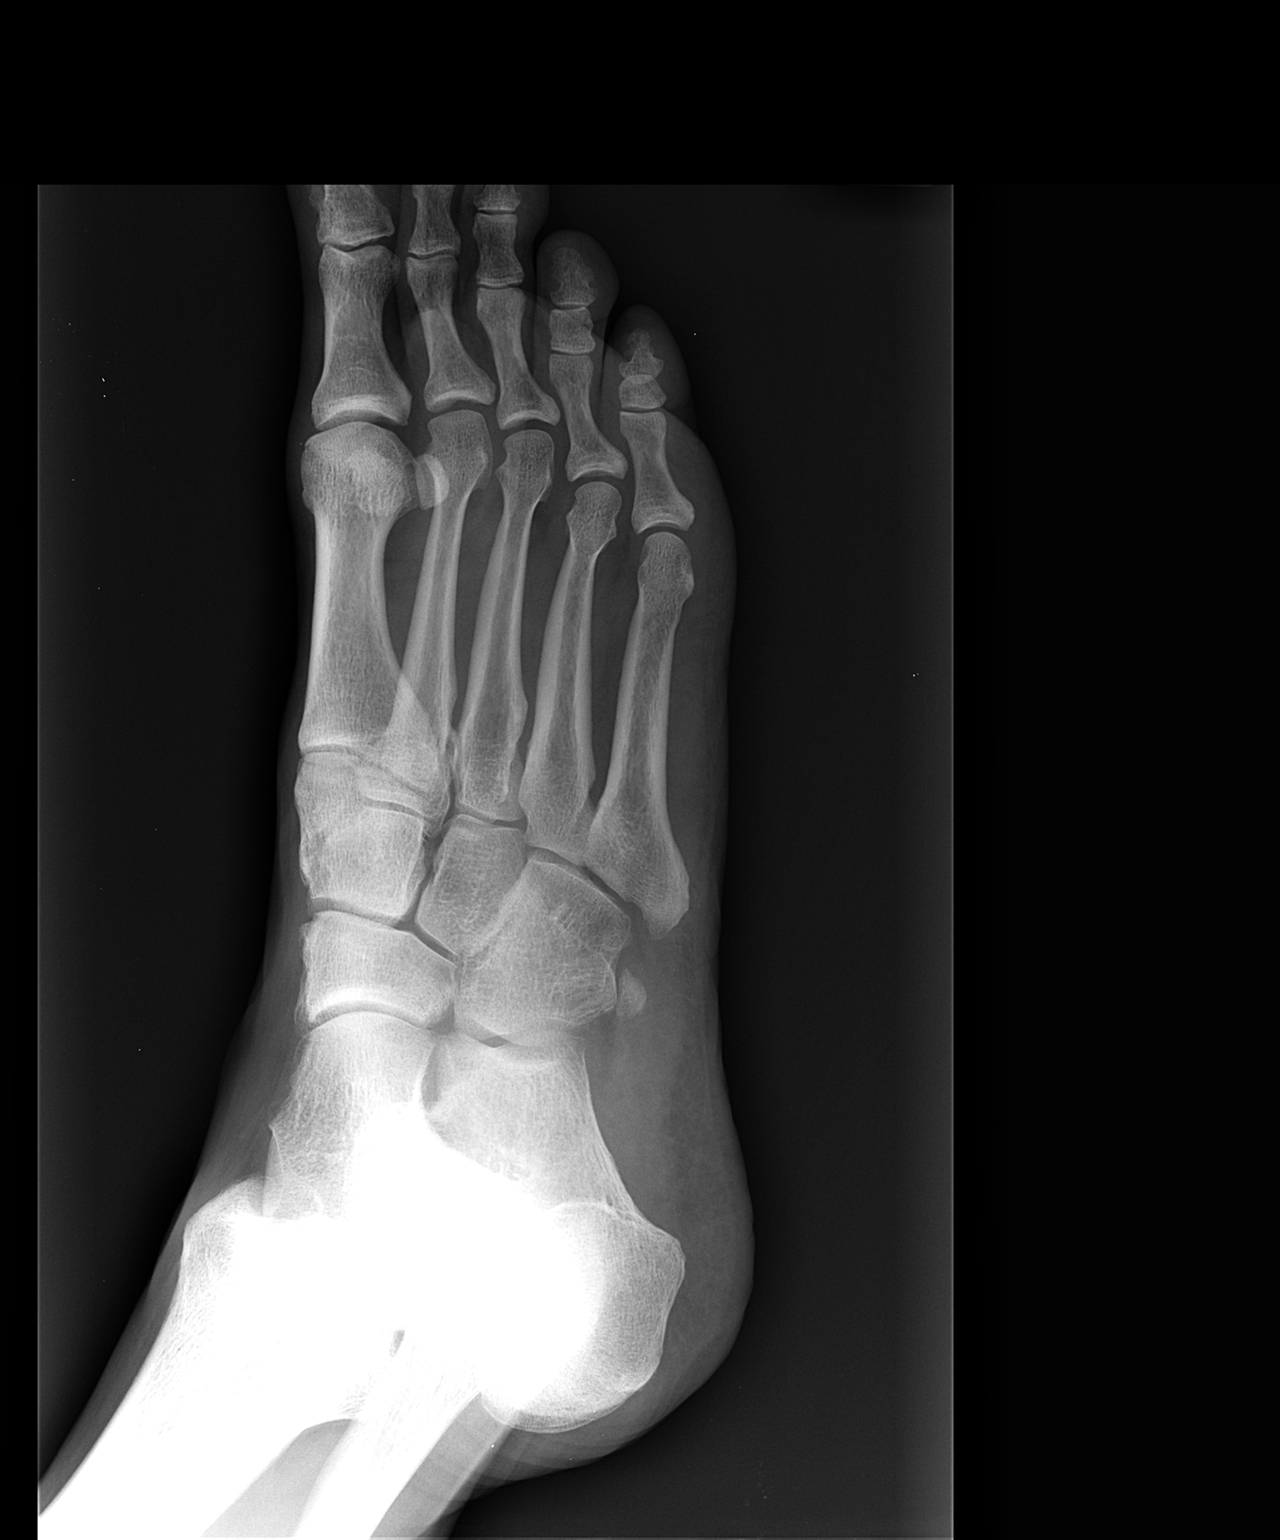

[view not recorded (3 of 3)]
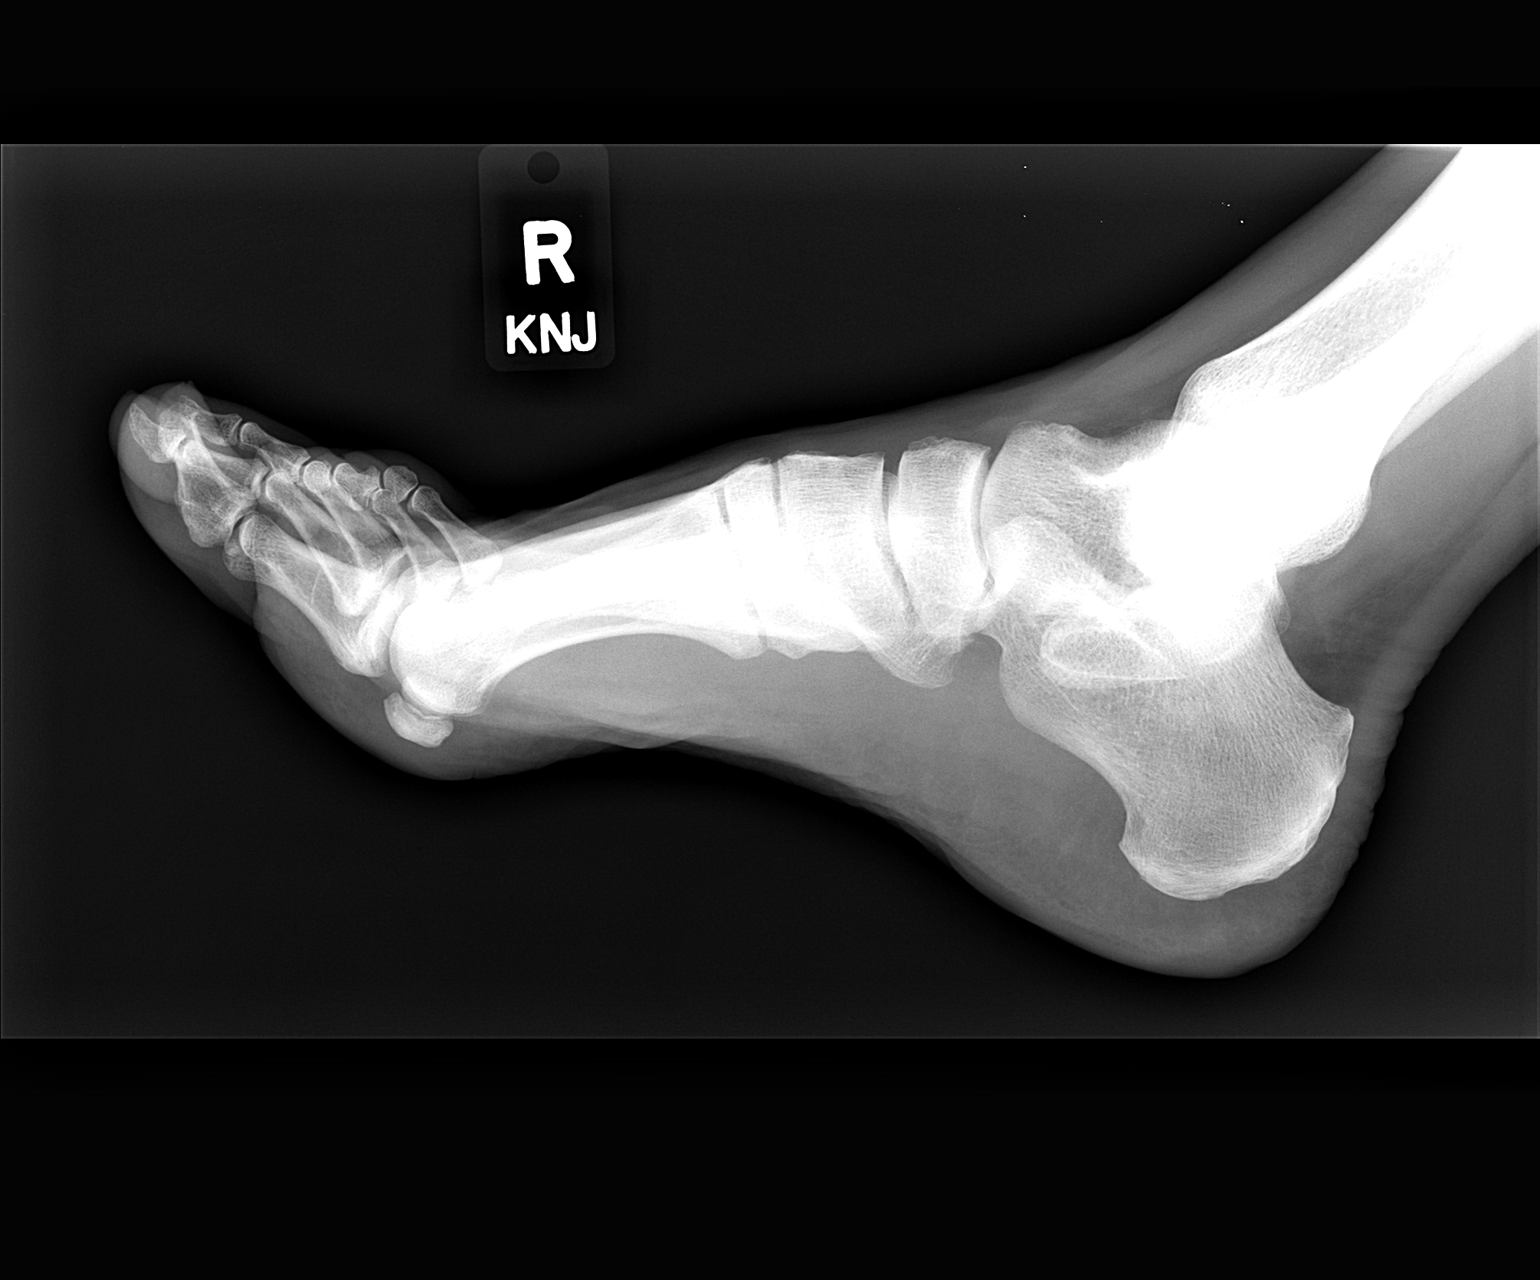

[3 of 3 positions shown; findings below may reference images not displayed]

FINDINGS: Bones, joint spaces and soft tissues are unremarkable.
There is no fracture or dislocation
IMPRESSION: No acute findings.

## 2014-05-09 ENCOUNTER — Encounter: Payer: Self-pay | Admitting: Family Medicine

## 2014-05-09 ENCOUNTER — Ambulatory Visit (INDEPENDENT_AMBULATORY_CARE_PROVIDER_SITE_OTHER): Payer: BC Managed Care – PPO | Admitting: Family Medicine

## 2014-05-09 VITALS — BP 132/70 | HR 70 | Temp 97.6°F | Wt 158.0 lb

## 2014-05-09 DIAGNOSIS — L259 Unspecified contact dermatitis, unspecified cause: Secondary | ICD-10-CM

## 2014-05-09 MED ORDER — METHYLPREDNISOLONE ACETATE 80 MG/ML IJ SUSP
120.0000 mg | Freq: Once | INTRAMUSCULAR | Status: AC
Start: 2014-05-09 — End: 2014-05-09
  Administered 2014-05-09: 120 mg via INTRAMUSCULAR

## 2014-05-09 MED ORDER — PREDNISONE 10 MG PO TABS: ORAL_TABLET | ORAL | Status: DC

## 2014-05-09 NOTE — Patient Instructions (Signed)

## 2014-05-09 NOTE — Progress Notes (Signed)
   Subjective:    Patient ID: Steven Hopkins, male    DOB: 14-Jun-1940, 73 y.o.   MRN: [id number]  HPI  Acute visit. Patient seen with pruritic rash on his face probably right side which started over the weekend. He handled some poinsettias and wondered if this may have been what irritated his face. He has slight burning but mostly itching. He also has some mild involvement of the left upper eyelid and left wrist. He tried hydrocortisone cream with out much relief. Symptoms of been moderate to severe especially at night.  Past Medical History  Diagnosis Date  . Basal cell carcinoma   . History of chickenpox   . Cardiac arrhythmia due to congenital heart disease   . Jaundice   . Hyperlipidemia   . Colon polyps    Past Surgical History  Procedure Laterality Date  . Cholecystectomy    . Tonsillectomy    . Hernia repair    . Basal cell carcinoma excision  2008    left and right temple  . Detached vitreous      reports that he has never smoked. He has never used smokeless tobacco. He reports that he drinks alcohol. He reports that he does not use illicit drugs. family history includes Arthritis in an other family member; Cancer in his father and mother; Depression in an other family member; Diabetes in an other family member; Stroke in his father. Allergies  Allergen Reactions  . Bactrim [Sulfamethoxazole-Trimethoprim] Rash     Review of Systems  Constitutional: Negative for fever and chills.  Skin: Positive for rash.       Objective:   Physical Exam  Constitutional: He appears well-developed and well-nourished.  Cardiovascular: Normal rate.   Skin: Rash noted.  Patient has macular to slightly raised erythematous rash in patch like distribution involving multiple areas of right face with a smaller patch left eyelid and left wrist. Nontender palpation          Assessment & Plan:  Contact dermatitis. Reviewed options. He's had difficulty taking oral steroids the past but  has tolerated injection. Depo-Medrol 120 mg IM given. We did write for brief smaller taper of oral prednisone to fill only if he has residual symptoms after injection.

## 2014-05-09 NOTE — Progress Notes (Signed)
Pre visit review using our clinic review tool, if applicable. No additional management support is needed unless otherwise documented below in the visit note. 

## 2014-06-17 ENCOUNTER — Ambulatory Visit (INDEPENDENT_AMBULATORY_CARE_PROVIDER_SITE_OTHER): Payer: Medicare Other | Admitting: *Deleted

## 2014-06-17 ENCOUNTER — Ambulatory Visit: Payer: Medicare Other | Admitting: Family Medicine

## 2014-06-17 DIAGNOSIS — Z23 Encounter for immunization: Secondary | ICD-10-CM | POA: Diagnosis not present

## 2014-06-23 ENCOUNTER — Encounter: Payer: Self-pay | Admitting: Family Medicine

## 2014-06-24 ENCOUNTER — Encounter: Payer: Self-pay | Admitting: Family Medicine

## 2014-06-24 ENCOUNTER — Ambulatory Visit (INDEPENDENT_AMBULATORY_CARE_PROVIDER_SITE_OTHER): Payer: Medicare Other | Admitting: Family Medicine

## 2014-06-24 VITALS — BP 130/70 | HR 85 | Temp 98.0°F | Wt 155.0 lb

## 2014-06-24 DIAGNOSIS — R300 Dysuria: Secondary | ICD-10-CM | POA: Diagnosis not present

## 2014-06-24 DIAGNOSIS — R319 Hematuria, unspecified: Secondary | ICD-10-CM

## 2014-06-24 LAB — POCT URINALYSIS DIPSTICK
Bilirubin, UA: NEGATIVE
Glucose, UA: NEGATIVE
Ketones, UA: NEGATIVE
Nitrite, UA: NEGATIVE
PH UA: 6.5
Spec Grav, UA: 1.02
UROBILINOGEN UA: 0.2

## 2014-06-24 MED ORDER — CIPROFLOXACIN HCL 500 MG PO TABS: 500.0000 mg | ORAL_TABLET | Freq: Two times a day (BID) | ORAL | Status: DC

## 2014-06-24 NOTE — Progress Notes (Signed)
   Subjective:    Patient ID: Steven Hopkins, male    DOB: [birth year], 74 y.o.   MRN: [id number]  HPI Patient seen with onset this past Monday some dysuria. Mostly urine frequency but some burning with urination as well. He felt this may been related to recently wearing a back brace. Chills couple days but no document of fever. No gross hematuria. No flank pain. No nausea or vomiting. No history of BPH or any urinary obstructive symptoms previously.  Past Medical History  Diagnosis Date  . Basal cell carcinoma   . History of chickenpox   . Cardiac arrhythmia due to congenital heart disease   . Jaundice   . Hyperlipidemia   . Colon polyps    Past Surgical History  Procedure Laterality Date  . Cholecystectomy    . Tonsillectomy    . Hernia repair    . Basal cell carcinoma excision  2008    left and right temple  . Detached vitreous      reports that he has never smoked. He has never used smokeless tobacco. He reports that he drinks alcohol. He reports that he does not use illicit drugs. family history includes Arthritis in an other family member; Cancer in his father and mother; Depression in an other family member; Diabetes in an other family member; Stroke in his father. Allergies  Allergen Reactions  . Bactrim [Sulfamethoxazole-Trimethoprim] Rash      Review of Systems  Constitutional: Positive for chills. Negative for fever.  Gastrointestinal: Negative for nausea and vomiting.  Genitourinary: Positive for dysuria and frequency. Negative for hematuria.       Objective:   Physical Exam  Constitutional: He appears well-developed and well-nourished.  Cardiovascular: Normal rate and regular rhythm.   Pulmonary/Chest: Effort normal and breath sounds normal. No respiratory distress. He has no wheezes. He has no rales.  Genitourinary:  Prostate is mildly enlarged but firm and only minimally tender. No rectal masses.          Assessment & Plan:  Dysuria. Rule out UTI.  Urinalysis obtained Urinalysis shows leukocytes and blood. Suspect acute cystitis. Urine culture sent. Start Cipro 500 g twice a day for 7 days pending culture results. Reassess 2 weeks consider Flomax for any progressive slow stream

## 2014-06-24 NOTE — Progress Notes (Signed)
Pre visit review using our clinic review tool, if applicable. No additional management support is needed unless otherwise documented below in the visit note. 

## 2014-06-24 NOTE — Patient Instructions (Signed)

## 2014-06-27 LAB — URINE CULTURE

## 2014-07-11 ENCOUNTER — Ambulatory Visit (INDEPENDENT_AMBULATORY_CARE_PROVIDER_SITE_OTHER): Payer: Medicare Other | Admitting: Family Medicine

## 2014-07-11 ENCOUNTER — Encounter: Payer: Self-pay | Admitting: Family Medicine

## 2014-07-11 DIAGNOSIS — R300 Dysuria: Secondary | ICD-10-CM

## 2014-07-11 LAB — POCT URINALYSIS DIPSTICK
Bilirubin, UA: NEGATIVE
Blood, UA: NEGATIVE
GLUCOSE UA: NEGATIVE
KETONES UA: NEGATIVE
LEUKOCYTES UA: NEGATIVE
NITRITE UA: NEGATIVE
PH UA: 6
Protein, UA: NEGATIVE
Spec Grav, UA: <=1.005
UROBILINOGEN UA: 0.2

## 2014-07-11 NOTE — Progress Notes (Signed)
   Subjective:    Patient ID: Steven Hopkins, male    DOB: Apr 07, 1941, 74 y.o.   MRN: [id number]  HPI Follow-up recent dysuria and UTI. He had culture that grew out Escherichia coli which was sensitive to everything. He was treated with full course of Cipro. His symptoms were improved after one day. No dysuria this time. No hematuria. No prior history of UTI. No obstructive urinary symptoms. He does have separate issue of 4 day history of sore throat mostly right side. No adenopathy. No fevers or chills. No postnasal drip symptoms.  Past Medical History  Diagnosis Date  . Basal cell carcinoma   . History of chickenpox   . Cardiac arrhythmia due to congenital heart disease   . Jaundice   . Hyperlipidemia   . Colon polyps    Past Surgical History  Procedure Laterality Date  . Cholecystectomy    . Tonsillectomy    . Hernia repair    . Basal cell carcinoma excision  2008    left and right temple  . Detached vitreous      reports that he has never smoked. He has never used smokeless tobacco. He reports that he drinks alcohol. He reports that he does not use illicit drugs. family history includes Arthritis in an other family member; Cancer in his father and mother; Depression in an other family member; Diabetes in an other family member; Stroke in his father. Allergies  Allergen Reactions  . Bactrim [Sulfamethoxazole-Trimethoprim] Rash      Review of Systems  Constitutional: Negative for fever and chills.  HENT: Positive for sore throat.   Genitourinary: Negative for dysuria and hematuria.       Objective:   Physical Exam  Constitutional: He appears well-developed and well-nourished.  HENT:  Mouth/Throat: Oropharynx is clear and moist.  Neck: Neck supple.  Cardiovascular: Normal rate and regular rhythm.   Pulmonary/Chest: Effort normal and breath sounds normal. No respiratory distress. He has no wheezes. He has no rales.  Lymphadenopathy:    He has no cervical adenopathy.           Assessment & Plan:  Recent UTI. Patient completed course of Cipro. Repeat urinalysis today reveals totally normal dipstick. Reassurance and no further evaluation at this time

## 2014-07-11 NOTE — Progress Notes (Signed)
Pre visit review using our clinic review tool, if applicable. No additional management support is needed unless otherwise documented below in the visit note. 

## 2014-08-16 DIAGNOSIS — L821 Other seborrheic keratosis: Secondary | ICD-10-CM | POA: Diagnosis not present

## 2014-08-16 DIAGNOSIS — L570 Actinic keratosis: Secondary | ICD-10-CM | POA: Diagnosis not present

## 2014-10-20 ENCOUNTER — Ambulatory Visit: Payer: Medicare Other | Admitting: Family Medicine

## 2014-10-20 ENCOUNTER — Encounter: Payer: Self-pay | Admitting: Family Medicine

## 2014-10-20 ENCOUNTER — Ambulatory Visit (INDEPENDENT_AMBULATORY_CARE_PROVIDER_SITE_OTHER): Payer: Medicare Other | Admitting: Family Medicine

## 2014-10-20 ENCOUNTER — Telehealth: Payer: Self-pay | Admitting: Family Medicine

## 2014-10-20 VITALS — BP 122/70 | HR 66 | Temp 97.4°F | Wt 152.0 lb

## 2014-10-20 DIAGNOSIS — R002 Palpitations: Secondary | ICD-10-CM

## 2014-10-20 NOTE — Patient Instructions (Signed)
Food Choices for Gastroesophageal Reflux Disease When you have gastroesophageal reflux disease (GERD), the foods you eat and your eating habits are very important. Choosing the right foods can help ease the discomfort of GERD. WHAT GENERAL GUIDELINES DO I NEED TO FOLLOW?  Choose fruits, vegetables, whole grains, low-fat dairy products, and low-fat meat, fish, and poultry.  Limit fats such as oils, salad dressings, butter, nuts, and avocado.  Keep a food diary to identify foods that cause symptoms.  Avoid foods that cause reflux. These may be different for different people.  Eat frequent small meals instead of three large meals each day.  Eat your meals slowly, in a relaxed setting.  Limit fried foods.  Cook foods using methods other than frying.  Avoid drinking alcohol.  Avoid drinking large amounts of liquids with your meals.  Avoid bending over or lying down until 2-3 hours after eating. WHAT FOODS ARE NOT RECOMMENDED? The following are some foods and drinks that may worsen your symptoms: Vegetables Tomatoes. Tomato juice. Tomato and spaghetti sauce. Chili peppers. Onion and garlic. Horseradish. Fruits Oranges, grapefruit, and lemon (fruit and juice). Meats High-fat meats, fish, and poultry. This includes hot dogs, ribs, ham, sausage, salami, and bacon. Dairy Whole milk and chocolate milk. Sour cream. Cream. Butter. Ice cream. Cream cheese.  Beverages Coffee and tea, with or without caffeine. Carbonated beverages or energy drinks. Condiments Hot sauce. Barbecue sauce.  Sweets/Desserts Chocolate and cocoa. Donuts. Peppermint and spearmint. Fats and Oils High-fat foods, including [place] fries and potato chips. Other Vinegar. Strong spices, such as black pepper, white pepper, red pepper, cayenne, curry powder, cloves, ginger, and chili powder. The items listed above may not be a complete list of foods and beverages to avoid. Contact your dietitian for more  information. Document Released: 05/27/2005 Document Revised: 06/01/2013 Document Reviewed: 03/31/2013 [facility] Patient Information 2015 [place], [place]. This information is not intended to replace advice given to you by your health care provider. Make sure you discuss any questions you have with your health care provider.  Let me know if palpitations persist or especially if you develop any dizziness or syncope.  We can look at getting heart.event monitor if any of those are noted.

## 2014-10-20 NOTE — Telephone Encounter (Signed)
[facility] - Client [facility] Patient Name: Steven Hopkins DOB: April 15, 1941 Initial Comment Caller states he has been having frequent irregular heartbeats. Nurse Assessment Nurse: Erlene Quan, RN, Manuela Schwartz Date/Time Eilene Ghazi Time): 10/20/2014 8:20:21 AM Confirm and document reason for call. If symptomatic, describe symptoms. ---Caller states he has been having frequent irregular heartbeats - he has had them for several years off and on - states seems like for last week it is happening more frequently - states did not sleep well - he has always been able to control it himself with breathing techniques but now it is just getting worse - no chest pain - has not been dx with afib no heart conditions Has the patient traveled out of the country within the last 30 days? ---Not Applicable Does the patient require triage? ---Yes Related visit to physician within the last 2 weeks? ---No Does the PT have any chronic conditions? (i.e. diabetes, asthma, etc.) ---No Guidelines Guideline Title Affirmed Question Affirmed Notes Heart Rate and Heartbeat Questions [1] Skipped or extra beat(s) AND [2] occurs 4 or more times per minute Final Disposition User See Physician within 4 Hours (or PCP triage) Erlene Quan, RN, Manuela Schwartz Comments Appointment scheduled for 10:45 this morning with Dr Garret Reddish

## 2014-10-20 NOTE — Progress Notes (Signed)
Pre visit review using our clinic review tool, if applicable. No additional management support is needed unless otherwise documented below in the visit note. 

## 2014-10-20 NOTE — Progress Notes (Signed)
   Subjective:    Patient ID: Steven Hopkins, male    DOB: 03/22/1941, 74 y.o.   MRN: [id number]  HPI Patient seen with palpitations. History of intermittent palpitations in the past. Possibly more frequent recently. Had episode last night where he had palpitations off and on for about 12 hours. He has never had any associated chest pains or any dizziness or syncope. No dyspnea. No clear triggering factors other than sometimes symptoms seemed to worse after periods of heavy eating. Minimal caffeine consumption. No decongestant use. No alleviating factors. Never had any exertional symptoms. Sometimes seem to be more positional. Monitor pulse yesterday and this is mostly in the 60s. Some sense of irregularity.  Past Medical History  Diagnosis Date  . Basal cell carcinoma   . History of chickenpox   . Cardiac arrhythmia due to congenital heart disease   . Jaundice   . Hyperlipidemia   . Colon polyps    Past Surgical History  Procedure Laterality Date  . Cholecystectomy    . Tonsillectomy    . Hernia repair    . Basal cell carcinoma excision  2008    left and right temple  . Detached vitreous      reports that he has never smoked. He has never used smokeless tobacco. He reports that he drinks alcohol. He reports that he does not use illicit drugs. family history includes Arthritis in an other family member; Cancer in his father and mother; Depression in an other family member; Diabetes in an other family member; Stroke in his father. Allergies  Allergen Reactions  . Bactrim [Sulfamethoxazole-Trimethoprim] Rash      Review of Systems  Constitutional: Negative for appetite change and unexpected weight change.  Respiratory: Negative for cough and shortness of breath.   Cardiovascular: Positive for palpitations. Negative for chest pain and leg swelling.  Gastrointestinal: Negative for abdominal pain.  Neurological: Negative for dizziness and syncope.       Objective:   Physical  Exam  Constitutional: He appears well-developed and well-nourished.  Neck: Neck supple. No thyromegaly present.  Cardiovascular: Normal rate, regular rhythm and normal heart sounds.  Exam reveals no gallop and no friction rub.   No murmur heard. Pulmonary/Chest: Effort normal and breath sounds normal. No respiratory distress. He has no wheezes. He has no rales.  Musculoskeletal: He exhibits no edema.          Assessment & Plan:  Palpitations. EKG today shows normal sinus rhythm. No worrisome associated features. We discussed cardiac event monitoring and at this point he wishes to wait. We discussed reduction in caffeine.  If symptoms persist or worsen consider event monitor and also consideration of low-dose beta blocker  He does have occasional GERD symptoms and we have discussed dietary measures to help manage that

## 2014-10-21 DIAGNOSIS — H35363 Drusen (degenerative) of macula, bilateral: Secondary | ICD-10-CM | POA: Diagnosis not present

## 2014-10-31 ENCOUNTER — Encounter: Payer: Self-pay | Admitting: Family Medicine

## 2014-10-31 ENCOUNTER — Ambulatory Visit (INDEPENDENT_AMBULATORY_CARE_PROVIDER_SITE_OTHER): Payer: Medicare Other | Admitting: Family Medicine

## 2014-10-31 VITALS — BP 130/70 | HR 68 | Temp 98.4°F | Wt 153.0 lb

## 2014-10-31 DIAGNOSIS — R002 Palpitations: Secondary | ICD-10-CM | POA: Diagnosis not present

## 2014-10-31 DIAGNOSIS — R1013 Epigastric pain: Secondary | ICD-10-CM

## 2014-10-31 MED ORDER — PANTOPRAZOLE SODIUM 40 MG PO TBEC: 40.0000 mg | DELAYED_RELEASE_TABLET | Freq: Every day | ORAL | Status: DC

## 2014-10-31 NOTE — Patient Instructions (Signed)
We will set up stress test Follow up promptly for any exertional or worsening chest pain Follow up for any difficulty swallowing or pain with swallowing. Start the Protonix 40 mg once daily.

## 2014-10-31 NOTE — Progress Notes (Signed)
Subjective:    Patient ID: Steven Hopkins, male    DOB: 05-Oct-1940, 74 y.o.   MRN: [id number]  HPI Patient seen with persistent palpitations. He has some vague epigastric discomfort which is never exertional. He denies any chest discomfort or dyspnea. He is concerned regarding whether he needs a "stress test".  No left arm pain, neck pain, diaphoresis, nausea.  No dysphagia. Refer to prior note:  "Patient seen with palpitations. History of intermittent palpitations in the past. Possibly more frequent recently. Had episode last night where he had palpitations off and on for about 12 hours. He has never had any associated chest pains or any dizziness or syncope. No dyspnea. No clear triggering factors other than sometimes symptoms seemed to worse after periods of heavy eating. Minimal caffeine consumption. No decongestant use. No alleviating factors. Never had any exertional symptoms. Sometimes seem to be more positional. Monitor pulse yesterday and this is mostly in the 60s. Some sense of irregularity."  We discussed possible event monitoring but he wishes to wait. He has reduce caffeine intake. He still feels he may be having some reflux symptoms. He has been cautious not to eat late at night. No recent appetite or weight changes. No dysphagia or Odynophagia. No family history of premature heart disease.  Recent EKG unremarkable  Past Medical History  Diagnosis Date  . Basal cell carcinoma   . History of chickenpox   . Cardiac arrhythmia due to congenital heart disease   . Jaundice   . Hyperlipidemia   . Colon polyps    Past Surgical History  Procedure Laterality Date  . Cholecystectomy    . Tonsillectomy    . Hernia repair    . Basal cell carcinoma excision  2008    left and right temple  . Detached vitreous      reports that he has never smoked. He has never used smokeless tobacco. He reports that he drinks alcohol. He reports that he does not use illicit drugs. family history  includes Arthritis in an other family member; Cancer in his father and mother; Depression in an other family member; Diabetes in an other family member; Stroke in his father. Allergies  Allergen Reactions  . Bactrim [Sulfamethoxazole-Trimethoprim] Rash       Review of Systems  Constitutional: Negative for appetite change, fatigue and unexpected weight change.  Eyes: Negative for visual disturbance.  Respiratory: Negative for cough, chest tightness and shortness of breath.   Cardiovascular: Positive for palpitations. Negative for chest pain and leg swelling.  Gastrointestinal: Negative for nausea and vomiting.  Neurological: Negative for dizziness, syncope, weakness, light-headedness and headaches.       Objective:   Physical Exam  Constitutional: He appears well-developed and well-nourished. No distress.  Neck: Neck supple. No thyromegaly present.  Cardiovascular: Normal rate and regular rhythm.   Pulmonary/Chest: Effort normal and breath sounds normal. No respiratory distress. He has no wheezes. He has no rales.  Abdominal: Soft. Bowel sounds are normal. He exhibits no distension and no mass. There is no tenderness. There is no rebound and no guarding.  Musculoskeletal: He exhibits no edema.  Lymphadenopathy:    He has no cervical adenopathy.          Assessment & Plan:  Patient presents with intermittent history of vague epigastric discomfort and palpitations. He's never had exertional symptoms. Recent EKG unremarkable.  He denies any red flags such as appetite or weight changes or any stool changes, nausea or vomiting, melena, etc. Start  Protonix 40 mg once daily. Set up exercise tolerance test to further evaluate. Reassess one month. Consider GI referral if symptoms persist at that time.  Would also consider event monitor if palpitations persist.

## 2014-10-31 NOTE — Progress Notes (Signed)
Pre visit review using our clinic review tool, if applicable. No additional management support is needed unless otherwise documented below in the visit note. 

## 2014-11-01 ENCOUNTER — Other Ambulatory Visit: Payer: Self-pay | Admitting: Family Medicine

## 2014-11-01 DIAGNOSIS — R1013 Epigastric pain: Secondary | ICD-10-CM

## 2014-11-01 DIAGNOSIS — R002 Palpitations: Secondary | ICD-10-CM

## 2014-11-18 DIAGNOSIS — H3531 Nonexudative age-related macular degeneration: Secondary | ICD-10-CM | POA: Diagnosis not present

## 2014-11-18 DIAGNOSIS — H18412 Arcus senilis, left eye: Secondary | ICD-10-CM | POA: Diagnosis not present

## 2014-11-18 DIAGNOSIS — H18411 Arcus senilis, right eye: Secondary | ICD-10-CM | POA: Diagnosis not present

## 2014-11-18 DIAGNOSIS — H26492 Other secondary cataract, left eye: Secondary | ICD-10-CM | POA: Diagnosis not present

## 2014-11-18 DIAGNOSIS — H18469 Peripheral corneal degeneration, unspecified eye: Secondary | ICD-10-CM | POA: Diagnosis not present

## 2014-11-28 DIAGNOSIS — H26492 Other secondary cataract, left eye: Secondary | ICD-10-CM | POA: Diagnosis not present

## 2014-12-01 ENCOUNTER — Ambulatory Visit (INDEPENDENT_AMBULATORY_CARE_PROVIDER_SITE_OTHER): Payer: Medicare Other | Admitting: Family Medicine

## 2014-12-01 ENCOUNTER — Encounter: Payer: Self-pay | Admitting: Family Medicine

## 2014-12-01 VITALS — BP 120/76 | HR 67 | Temp 98.1°F | Wt 152.0 lb

## 2014-12-01 DIAGNOSIS — K219 Gastro-esophageal reflux disease without esophagitis: Secondary | ICD-10-CM | POA: Diagnosis not present

## 2014-12-01 NOTE — Progress Notes (Signed)
   Subjective:    Patient ID: Steven Hopkins, male    DOB: 30-Apr-1941, 74 y.o.   MRN: [id number]  HPI Patient seen for follow-up regarding recent palpitations and vague nonexertional epigastric discomfort. He has stress test which is pending in July. We suspected he may be having some reflux type issues and we started pantoprazole and symptoms have improved. He has been more diligent to avoid exercise and activity after eating. Palpitations have improved. No syncope. Minimal caffeine use. No recent dysphagia. No appetite or weight changes.  Past Medical History  Diagnosis Date  . Basal cell carcinoma   . History of chickenpox   . Cardiac arrhythmia due to congenital heart disease   . Jaundice   . Hyperlipidemia   . Colon polyps    Past Surgical History  Procedure Laterality Date  . Cholecystectomy    . Tonsillectomy    . Hernia repair    . Basal cell carcinoma excision  2008    left and right temple  . Detached vitreous      reports that he has never smoked. He has never used smokeless tobacco. He reports that he drinks alcohol. He reports that he does not use illicit drugs. family history includes Arthritis in an other family member; Cancer in his father and mother; Depression in an other family member; Diabetes in an other family member; Stroke in his father. Allergies  Allergen Reactions  . Bactrim [Sulfamethoxazole-Trimethoprim] Rash  '    Review of Systems  Constitutional: Negative for appetite change and unexpected weight change.  Respiratory: Negative for shortness of breath.   Cardiovascular: Negative for chest pain.  Gastrointestinal: Negative for abdominal pain.       Objective:   Physical Exam  Constitutional: He appears well-developed and well-nourished.  Cardiovascular: Normal rate and regular rhythm.   Pulmonary/Chest: Effort normal and breath sounds normal. No respiratory distress. He has no wheezes. He has no rales.  Abdominal:  Small ventral hernia  which is soft and nontender. No abdominal tenderness. No hepatomegaly or splenomegaly.          Assessment & Plan:  Recent palpitations and epigastric discomfort. Suspect GERD. Question vagal nerve stimulation related to GERD. His palpitations have improved as GERD has been controlled. He will complete another couple weeks on Protonix and then transition to over-the-counter Zantac or Pepcid as needed. Reflux precautions discussed. Avoid heavy physical exertion with a full stomach. Continue to minimize caffeine intake.

## 2014-12-01 NOTE — Patient Instructions (Signed)
Consider use of over the counter Zantac or Pepcid for as needed for reflux.

## 2014-12-01 NOTE — Progress Notes (Signed)
Pre visit review using our clinic review tool, if applicable. No additional management support is needed unless otherwise documented below in the visit note. 

## 2014-12-05 ENCOUNTER — Other Ambulatory Visit: Payer: Self-pay

## 2014-12-13 DIAGNOSIS — H26491 Other secondary cataract, right eye: Secondary | ICD-10-CM | POA: Diagnosis not present

## 2014-12-19 DIAGNOSIS — H26491 Other secondary cataract, right eye: Secondary | ICD-10-CM | POA: Diagnosis not present

## 2014-12-20 ENCOUNTER — Encounter: Payer: Medicare Other | Admitting: Nurse Practitioner

## 2014-12-20 ENCOUNTER — Ambulatory Visit (INDEPENDENT_AMBULATORY_CARE_PROVIDER_SITE_OTHER): Payer: Medicare Other

## 2014-12-20 DIAGNOSIS — R1013 Epigastric pain: Secondary | ICD-10-CM | POA: Diagnosis not present

## 2014-12-20 DIAGNOSIS — R002 Palpitations: Secondary | ICD-10-CM

## 2014-12-20 LAB — EXERCISE TOLERANCE TEST
CHL CUP RESTING HR STRESS: 61 {beats}/min
CHL RATE OF PERCEIVED EXERTION: 16
CSEPED: 5 min
CSEPEW: 7 METS
CSEPHR: 116 %
CSEPPHR: 171 {beats}/min
Exercise duration (sec): 40 s
MPHR: 148 {beats}/min

## 2014-12-21 ENCOUNTER — Telehealth: Payer: Self-pay

## 2014-12-21 NOTE — Telephone Encounter (Signed)
Pt informed of results of his Excerise Tolerance Test.

## 2015-09-13 ENCOUNTER — Ambulatory Visit (INDEPENDENT_AMBULATORY_CARE_PROVIDER_SITE_OTHER): Payer: Medicare Other | Admitting: Family Medicine

## 2015-09-13 ENCOUNTER — Encounter: Payer: Self-pay | Admitting: Family Medicine

## 2015-09-13 VITALS — BP 130/80 | HR 91 | Temp 97.5°F | Ht 67.5 in | Wt 150.1 lb

## 2015-09-13 DIAGNOSIS — R040 Epistaxis: Secondary | ICD-10-CM

## 2015-09-13 NOTE — Progress Notes (Signed)
Pre visit review using our clinic review tool, if applicable. No additional management support is needed unless otherwise documented below in the visit note. 

## 2015-09-13 NOTE — Patient Instructions (Signed)
Nosebleed Nosebleeds are common. They are due to a crack in the inside lining of your nose (mucous membrane) or from a small blood vessel that starts to bleed. Nosebleeds can be caused by many conditions, such as injury, infections, dry mucous membranes or dry climate, medicines, nose picking, and home heating and cooling systems. Most nosebleeds come from blood vessels in the front of your nose. HOME CARE INSTRUCTIONS   Try controlling your nosebleed by pinching your nostrils gently and continuously for at least 10 minutes.  Avoid blowing or sniffing your nose for a number of hours after having a nosebleed.  Do not put gauze inside your nose yourself. If your nose was packed by your health care provider, try to maintain the pack inside of your nose until your health care provider removes it.  If a gauze pack was used and it starts to fall out, gently replace it or cut off the end of it.  If a balloon catheter was used to pack your nose, do not cut or remove it unless your health care provider has instructed you to do that.  Avoid lying down while you are having a nosebleed. Sit up and lean forward.  Use a nasal spray decongestant to help with a nosebleed as directed by your health care provider.  Do not use petroleum jelly or mineral oil in your nose. These can drip into your lungs.  Maintain humidity in your home by using less air conditioning or by using a humidifier.  Aspirinand blood thinners make bleeding more likely. If you are prescribed these medicines and you suffer from nosebleeds, ask your health care provider if you should stop taking the medicines or adjust the dose. Do not stop medicines unless directed by your health care provider  Resume your normal activities as you are able, but avoid straining, lifting, or bending at the waist for several days.  If your nosebleed was caused by dry mucous membranes, use over-the-counter saline nasal spray or gel. This will keep the  mucous membranes moist and allow them to heal. If you must use a lubricant, choose the water-soluble variety. Use it only sparingly, and do not use it within several hours of lying down.  Keep all follow-up visits as directed by your health care provider. This is important. SEEK MEDICAL CARE IF:  You have a fever.  You get frequent nosebleeds.  You are getting nosebleeds more often. SEEK IMMEDIATE MEDICAL CARE IF:  Your nosebleed lasts longer than 20 minutes.  Your nosebleed occurs after an injury to your face, and your nose looks crooked or broken.  You have unusual bleeding from other parts of your body.  You have unusual bruising on other parts of your body.  You feel light-headed or you faint.  You become sweaty.  You vomit blood.  Your nosebleed occurs after a head injury.   This information is not intended to replace advice given to you by your health care provider. Make sure you discuss any questions you have with your health care provider.   Document Released: 03/06/2005 Document Revised: 06/17/2014 Document Reviewed: 01/10/2014 Elsevier Interactive Patient Education 2016 [employer].  Scale back aspirin use for now Consider OTC Zyrtec, Claritan, or Allegra.

## 2015-09-13 NOTE — Progress Notes (Signed)
   Subjective:    Patient ID: Steven Hopkins, male    DOB: [birth year], 75 y.o.   MRN: [id number]  HPI Patient seen with nosebleeds intermittently. Long history of nosebleeds. Takes aspirin 325 mg once daily. Last winter he started using some type of over-the-counter nasal saline based gel. He did not have any nosebleed for 5 months. Then about 3 weeks ago started having recurrent nosebleeds. Increased allergic symptoms with rhinorrhea and sneezing. He thinks this is related to pollen.  Denies any history of hypertension. No headaches. No general bruising or other bleeding concerns  Past Medical History  Diagnosis Date  . Basal cell carcinoma   . History of chickenpox   . Cardiac arrhythmia due to congenital heart disease   . Jaundice   . Hyperlipidemia   . Colon polyps    Past Surgical History  Procedure Laterality Date  . Cholecystectomy    . Tonsillectomy    . Hernia repair    . Basal cell carcinoma excision  2008    left and right temple  . Detached vitreous      reports that he has never smoked. He has never used smokeless tobacco. He reports that he drinks alcohol. He reports that he does not use illicit drugs. family history includes Cancer in his father and mother; Stroke in his father. Allergies  Allergen Reactions  . Bactrim [Sulfamethoxazole-Trimethoprim] Rash      Review of Systems  Constitutional: Negative for fatigue.  HENT: Positive for congestion. Negative for sinus pressure.   Eyes: Negative for visual disturbance.  Respiratory: Negative for cough, chest tightness and shortness of breath.   Cardiovascular: Negative for chest pain, palpitations and leg swelling.  Neurological: Negative for dizziness, syncope, weakness, light-headedness and headaches.  Hematological: Does not bruise/bleed easily.       Objective:   Physical Exam  Constitutional: He appears well-developed and well-nourished.  HENT:  Mouth/Throat: Oropharynx is clear and moist.    Right naris is clear. Left naris reveals dilated superficial blood vessel anterior medial septum. No ulceration  Cardiovascular: Normal rate and regular rhythm.   Pulmonary/Chest: Effort normal and breath sounds normal. No respiratory distress. He has no wheezes. He has no rales.          Assessment & Plan:  Epistaxis. Avoid aspirin for now. Avoid allergy nose sprays such as Flonase which could exacerbate. Consider over-the-counter Claritin, Allegra, or Zyrtec for allergy symptoms. We discussed risk and benefits of application of silver nitrate to left naris and patient consented. There are 2 identified areas that look like sites for probable recent bleed. No active bleeding noted this time. Treated with silver nitrate swab without difficulty. Follow-up if symptoms persist

## 2015-10-23 ENCOUNTER — Encounter: Payer: Self-pay | Admitting: Family Medicine

## 2015-10-23 ENCOUNTER — Ambulatory Visit (INDEPENDENT_AMBULATORY_CARE_PROVIDER_SITE_OTHER): Payer: Medicare Other | Admitting: Family Medicine

## 2015-10-23 VITALS — BP 120/78 | HR 75 | Temp 98.4°F | Ht 67.5 in | Wt 149.0 lb

## 2015-10-23 DIAGNOSIS — L03114 Cellulitis of left upper limb: Secondary | ICD-10-CM | POA: Diagnosis not present

## 2015-10-23 MED ORDER — DOXYCYCLINE HYCLATE 100 MG PO CAPS: 100.0000 mg | ORAL_CAPSULE | Freq: Two times a day (BID) | ORAL | Status: DC

## 2015-10-23 NOTE — Patient Instructions (Signed)
Consider warm compresses to left arm couple of times daily. Let me know if redness not clearing in one week.

## 2015-10-23 NOTE — Progress Notes (Signed)
   Subjective:    Patient ID: Steven Hopkins, male    DOB: [birth year], 75 y.o.   MRN: [id number]  HPI  Patient seen with possible "bite" left forearm.  He noted last Wednesday was sounds like a small erythematous papule.  Subsequently developed some surrounding redness.  He initially noticed some itching followed by soreness.  Pustular center which he unroofed.  No fevers or chills. No history of MRSA.  He states redness is slightly improved compared to yesterday.  Did not observe any actual bites recently.  Allergy to sulfa.  Past Medical History  Diagnosis Date  . Basal cell carcinoma   . History of chickenpox   . Cardiac arrhythmia due to congenital heart disease   . Jaundice   . Hyperlipidemia   . Colon polyps    Past Surgical History  Procedure Laterality Date  . Cholecystectomy    . Tonsillectomy    . Hernia repair    . Basal cell carcinoma excision  2008    left and right temple  . Detached vitreous      reports that he has never smoked. He has never used smokeless tobacco. He reports that he drinks alcohol. He reports that he does not use illicit drugs. family history includes Cancer in his father and mother; Stroke in his father. Allergies  Allergen Reactions  . Bactrim [Sulfamethoxazole-Trimethoprim] Rash      Review of Systems  Constitutional: Negative for fever and chills.  Hematological: Negative for adenopathy.       Objective:   Physical Exam  Constitutional: He appears well-developed and well-nourished.  Cardiovascular: Normal rate and regular rhythm.   Pulmonary/Chest: Effort normal and breath sounds normal. No respiratory distress. He has no wheezes. He has no rales.  Skin:  Left forearm-2.5 x 4 cm area of erythema. Minimally tender. No fluctuance. Punctate central pustule          Assessment & Plan:   Cellulitis left forearm. Suspect related to staph. No prior history of MRSA. No fluctuance. Start doxycycline 100 mg twice a day for 10  days for MRSA coverage. If he has no recurrent pustules get in earlier to culture. Follow-up if this is not resolving over the next week.  Steven Post MD [facility] [place]

## 2015-10-23 NOTE — Progress Notes (Signed)
Pre visit review using our clinic review tool, if applicable. No additional management support is needed unless otherwise documented below in the visit note. 

## 2016-03-26 ENCOUNTER — Ambulatory Visit (INDEPENDENT_AMBULATORY_CARE_PROVIDER_SITE_OTHER): Payer: Medicare Other | Admitting: *Deleted

## 2016-03-26 DIAGNOSIS — Z23 Encounter for immunization: Secondary | ICD-10-CM | POA: Diagnosis not present

## 2016-03-29 ENCOUNTER — Encounter: Payer: Self-pay | Admitting: Family Medicine

## 2016-03-29 DIAGNOSIS — D0462 Carcinoma in situ of skin of left upper limb, including shoulder: Secondary | ICD-10-CM | POA: Diagnosis not present

## 2016-03-29 DIAGNOSIS — L570 Actinic keratosis: Secondary | ICD-10-CM | POA: Diagnosis not present

## 2016-03-29 DIAGNOSIS — C44519 Basal cell carcinoma of skin of other part of trunk: Secondary | ICD-10-CM | POA: Diagnosis not present

## 2016-04-19 DIAGNOSIS — D0462 Carcinoma in situ of skin of left upper limb, including shoulder: Secondary | ICD-10-CM | POA: Diagnosis not present

## 2016-04-19 DIAGNOSIS — Z85828 Personal history of other malignant neoplasm of skin: Secondary | ICD-10-CM | POA: Diagnosis not present

## 2016-04-19 DIAGNOSIS — C44519 Basal cell carcinoma of skin of other part of trunk: Secondary | ICD-10-CM | POA: Diagnosis not present

## 2016-04-19 DIAGNOSIS — L570 Actinic keratosis: Secondary | ICD-10-CM | POA: Diagnosis not present

## 2016-09-30 DIAGNOSIS — H1132 Conjunctival hemorrhage, left eye: Secondary | ICD-10-CM | POA: Diagnosis not present

## 2016-10-15 DIAGNOSIS — L814 Other melanin hyperpigmentation: Secondary | ICD-10-CM | POA: Diagnosis not present

## 2016-10-15 DIAGNOSIS — C44612 Basal cell carcinoma of skin of right upper limb, including shoulder: Secondary | ICD-10-CM | POA: Diagnosis not present

## 2016-10-15 DIAGNOSIS — L821 Other seborrheic keratosis: Secondary | ICD-10-CM | POA: Diagnosis not present

## 2016-10-15 DIAGNOSIS — Z85828 Personal history of other malignant neoplasm of skin: Secondary | ICD-10-CM | POA: Diagnosis not present

## 2016-10-15 DIAGNOSIS — L570 Actinic keratosis: Secondary | ICD-10-CM | POA: Diagnosis not present

## 2016-10-15 DIAGNOSIS — C44519 Basal cell carcinoma of skin of other part of trunk: Secondary | ICD-10-CM | POA: Diagnosis not present

## 2016-10-15 DIAGNOSIS — D1801 Hemangioma of skin and subcutaneous tissue: Secondary | ICD-10-CM | POA: Diagnosis not present

## 2017-01-23 DIAGNOSIS — M5431 Sciatica, right side: Secondary | ICD-10-CM | POA: Diagnosis not present

## 2017-01-23 DIAGNOSIS — M9903 Segmental and somatic dysfunction of lumbar region: Secondary | ICD-10-CM | POA: Diagnosis not present

## 2017-01-27 DIAGNOSIS — M9903 Segmental and somatic dysfunction of lumbar region: Secondary | ICD-10-CM | POA: Diagnosis not present

## 2017-01-27 DIAGNOSIS — M5431 Sciatica, right side: Secondary | ICD-10-CM | POA: Diagnosis not present

## 2017-01-28 DIAGNOSIS — M5431 Sciatica, right side: Secondary | ICD-10-CM | POA: Diagnosis not present

## 2017-01-28 DIAGNOSIS — M9903 Segmental and somatic dysfunction of lumbar region: Secondary | ICD-10-CM | POA: Diagnosis not present

## 2017-01-30 DIAGNOSIS — M5431 Sciatica, right side: Secondary | ICD-10-CM | POA: Diagnosis not present

## 2017-01-30 DIAGNOSIS — M9903 Segmental and somatic dysfunction of lumbar region: Secondary | ICD-10-CM | POA: Diagnosis not present

## 2017-02-03 DIAGNOSIS — M5431 Sciatica, right side: Secondary | ICD-10-CM | POA: Diagnosis not present

## 2017-02-03 DIAGNOSIS — M9903 Segmental and somatic dysfunction of lumbar region: Secondary | ICD-10-CM | POA: Diagnosis not present

## 2017-02-04 DIAGNOSIS — M5431 Sciatica, right side: Secondary | ICD-10-CM | POA: Diagnosis not present

## 2017-02-04 DIAGNOSIS — M9903 Segmental and somatic dysfunction of lumbar region: Secondary | ICD-10-CM | POA: Diagnosis not present

## 2017-02-06 DIAGNOSIS — M5431 Sciatica, right side: Secondary | ICD-10-CM | POA: Diagnosis not present

## 2017-02-06 DIAGNOSIS — M9903 Segmental and somatic dysfunction of lumbar region: Secondary | ICD-10-CM | POA: Diagnosis not present

## 2017-02-11 DIAGNOSIS — M9903 Segmental and somatic dysfunction of lumbar region: Secondary | ICD-10-CM | POA: Diagnosis not present

## 2017-02-11 DIAGNOSIS — M5431 Sciatica, right side: Secondary | ICD-10-CM | POA: Diagnosis not present

## 2017-02-13 DIAGNOSIS — M9903 Segmental and somatic dysfunction of lumbar region: Secondary | ICD-10-CM | POA: Diagnosis not present

## 2017-02-13 DIAGNOSIS — M5431 Sciatica, right side: Secondary | ICD-10-CM | POA: Diagnosis not present

## 2017-02-27 ENCOUNTER — Encounter: Payer: Self-pay | Admitting: Family Medicine

## 2017-04-03 ENCOUNTER — Ambulatory Visit (INDEPENDENT_AMBULATORY_CARE_PROVIDER_SITE_OTHER): Payer: Medicare Other | Admitting: *Deleted

## 2017-04-03 DIAGNOSIS — Z23 Encounter for immunization: Secondary | ICD-10-CM

## 2017-10-03 ENCOUNTER — Ambulatory Visit: Payer: Medicare Other

## 2017-12-22 ENCOUNTER — Ambulatory Visit (INDEPENDENT_AMBULATORY_CARE_PROVIDER_SITE_OTHER): Payer: Medicare Other | Admitting: Internal Medicine

## 2017-12-22 ENCOUNTER — Ambulatory Visit: Payer: Self-pay | Admitting: *Deleted

## 2017-12-22 ENCOUNTER — Encounter: Payer: Self-pay | Admitting: Internal Medicine

## 2017-12-22 VITALS — BP 130/69 | HR 61 | Temp 98.3°F | Wt 152.0 lb

## 2017-12-22 DIAGNOSIS — E785 Hyperlipidemia, unspecified: Secondary | ICD-10-CM | POA: Diagnosis not present

## 2017-12-22 DIAGNOSIS — R002 Palpitations: Secondary | ICD-10-CM | POA: Diagnosis not present

## 2017-12-22 NOTE — Progress Notes (Signed)
Chief Complaint  Patient presents with  . Palpitations    HPI: AMI Steven Hopkins 77 y.o. come in for sda appt   For  Off and on palpitations more consistent continuous recently and  PCP NA.  Last seen by PCP over 2 years ago .   Seen for palpitations in 2016  Was  Told to do life style  changes  Did better   But  Over past week  Much more frequent like a  Skipped beat flip flop no racing all different  times  May be  More with inc hr   No syncope cp .  Thinks  Stress is a factor .        Father passes lung cancer in 30s w lung cancer  Mom live to late 90s   No fam hx of heart arrhythmias no hx of      12/22/17 10:03 AM  Note    Pt reports palpitations x 1 week; not daily, vary in duration and frequency. H/O 'cardiac arrhythmia due to congenital heart disease' . States h/o similar symptoms "Years ago" and was given medication, unsure of name. Has not occurred since. Denies CP, SOB, dizziness, does not increase with exertion. States vary in duration and frequency, few seconds to minutes.  States has been under stress lately. States when palpitations occur, "I use techniques to get them to stop; control my breathing, calm myself down, no caffeine or alcohol." HR during call 60. Pt states 4 palpitations at that time "But first time since yesterday." States was working out in the yard, states was staying hydrated. Same day appt made with Dr. Regis Bill as no availability with Dr. Elease Hashimoto or C. Nafziger. Care advise given per protocol. Instructed to go to ED if palpitations increase in duration and/or frequency, dizziness or CP occur.   Reason for Disposition . Age > 60 years (Exception: brief heart beat symptoms that went away and now feels well)    Palpitations x 1 week.            ROS: See pertinent positives and negatives per HPI.  On 5  Fu for  Facial lesions   Past Medical History:  Diagnosis Date  . Basal cell carcinoma   . Cardiac arrhythmia due to congenital heart  disease   . Colon polyps   . History of chickenpox   . Hyperlipidemia   . Jaundice     Family History  Problem Relation Age of Onset  . Cancer Mother   . Cancer Father        lung  . Stroke Father   . Depression Unknown   . Arthritis Unknown   . Diabetes Unknown     Social History   Socioeconomic History  . Marital status: Married    Spouse name: Not on file  . Number of children: Not on file  . Years of education: Not on file  . Highest education level: Not on file  Occupational History  . Not on file  Social Needs  . Financial resource strain: Not on file  . Food insecurity:    Worry: Not on file    Inability: Not on file  . Transportation needs:    Medical: Not on file    Non-medical: Not on file  Tobacco Use  . Smoking status: Never Smoker  . Smokeless tobacco: Never Used  Substance and Sexual Activity  . Alcohol use: Yes  . Drug use: No  . Sexual activity: Not on file  Lifestyle  . Physical activity:    Days per week: Not on file    Minutes per session: Not on file  . Stress: Not on file  Relationships  . Social connections:    Talks on phone: Not on file    Gets together: Not on file    Attends religious service: Not on file    Active member of club or organization: Not on file    Attends meetings of clubs or organizations: Not on file    Relationship status: Not on file  Other Topics Concern  . Not on file  Social History Narrative  . Not on file    Outpatient Medications Prior to Visit  Medication Sig Dispense Refill  . aspirin 81 MG tablet Take 81 mg by mouth daily.    . B Complex-C (B-COMPLEX WITH VITAMIN C) tablet Take 1 tablet by mouth daily.    . Cholecalciferol (VITAMIN D3) 1000 UNITS CAPS Take 1 capsule by mouth 2 (two) times daily.     . Coenzyme Q10 (COQ10) 200 MG CAPS Take 200 mg by mouth daily.    . fish oil-omega-3 fatty acids 1000 MG capsule Take 1 g by mouth.     . Multiple Vitamin (MULTIVITAMIN) tablet Take 1 tablet by mouth  daily.    Marland Kitchen doxycycline (VIBRAMYCIN) 100 MG capsule Take 1 capsule (100 mg total) by mouth 2 (two) times daily. (Patient not taking: Reported on 12/22/2017) 20 capsule 0   No facility-administered medications prior to visit.      EXAM:  BP 130/69   Pulse 61   Temp 98.3 F (36.8 C)   Wt 152 lb (68.9 kg)   BMI 23.46 kg/m   Body mass index is 23.46 kg/m.  GENERAL: vitals reviewed and listed above, alert, oriented, appears well hydrated and in no acute distress HEENT: atraumatic, conjunctiva  clear, no obvious abnormalities on inspection of external nose and ears NECK: no obvious masses on inspection palpation  LUNGS: clear to auscultation bilaterally, no wheezes, rales or rhonchi, good air movement CV: HRRR, no clubbing cyanosis or  peripheral edema nl cap refill  Abdomen:  Sof,t normal bowel sounds without hepatosplenomegaly, no guarding rebound or masses no CVA tenderness MS: moves all extremities without noticeable focal  abnormality PSYCH: pleasant and cooperative, no obvious depression or anxiety  BP Readings from Last 3 Encounters:  12/22/17 130/69  10/23/15 120/78  09/13/15 130/80  ekg no acute changes sinus rate 60  Read   As ant fascic block   Lad  But no sig change from last  ekg x axis -43 from -17   ASSESSMENT AND PLAN:  Discussed the following assessment and plan:  Palpitations - Plan: EKG 12-Lead, Comprehensive metabolic panel, CBC with Differential/Platelet, Lipid panel, TSH, T4, free, T4, free, TSH, Lipid panel, CBC with Differential/Platelet, Comprehensive metabolic panel  Hyperlipidemia, unspecified hyperlipidemia type - Plan: Lipid panel, Lipid panel No alarm sx  And reassuring exam .    Reviewed causes   r/o metabolic labs NF ate lunch Disc  stress issues  Strategies   .   Consider  echo  And or event monitor holter  If appropriate .   To make fu with  PCP in 3-4 weeks  Or sooner if  Alarming sx.  -Patient advised to return or notify health care team  if   new concerns arise.  Patient Instructions  Checking thyroid   And blood sugar  cholesterol labs   Palpitations are very common and  most are not related to  Serious   Health problems  But   Checking  Metabolic screening   .   Plan   Fu with  Dr B     In 3-4 weeks  Or as needed   Can  consider   ekg monitoring  To get it  "on tape" but I agree  stress   caffeine alcohol a common trigger     Palpitations A palpitation is the feeling that your heartbeat is irregular or is faster than normal. It may feel like your heart is fluttering or skipping a beat. Palpitations are usually not a serious problem. They may be caused by many things, including smoking, caffeine, alcohol, stress, and certain medicines. Although most causes of palpitations are not serious, palpitations can be a sign of a serious medical problem. In some cases, you may need further medical evaluation. Follow these instructions at home: Pay attention to any changes in your symptoms. Take these actions to help with your condition:  Avoid the following: ? Caffeinated coffee, tea, soft drinks, diet pills, and energy drinks. ? Chocolate. ? Alcohol.  Do not use any tobacco products, such as cigarettes, chewing tobacco, and e-cigarettes. If you need help quitting, ask your health care provider.  Try to reduce your stress and anxiety. Things that can help you relax include: ? Yoga. ? Meditation. ? Physical activity, such as swimming, jogging, or walking. ? Biofeedback. This is a method that helps you learn to use your mind to control things in your body, such as your heartbeats.  Get plenty of rest and sleep.  Take over-the-counter and prescription medicines only as told by your health care provider.  Keep all follow-up visits as told by your health care provider. This is important.  Contact a health care provider if:  You continue to have a fast or irregular heartbeat after 24 hours.  Your palpitations occur more often. Get  help right away if:  You have chest pain or shortness of breath.  You have a severe headache.  You feel dizzy or you faint. This information is not intended to replace advice given to you by your health care provider. Make sure you discuss any questions you have with your health care provider. Document Released: 05/24/2000 Document Revised: 10/30/2015 Document Reviewed: 02/09/2015 Elsevier Interactive Patient Education  2018 [place]. Panosh M.D.

## 2017-12-22 NOTE — Patient Instructions (Addendum)
Checking thyroid   And blood sugar  cholesterol labs   Palpitations are very common and most are not related to  Serious   Health problems  But   Checking  Metabolic screening   .   Plan   Fu with  Dr B     In 3-4 weeks  Or as needed   Can  consider   ekg monitoring  To get it  "on tape" but I agree  stress   caffeine alcohol a common trigger     Palpitations A palpitation is the feeling that your heartbeat is irregular or is faster than normal. It may feel like your heart is fluttering or skipping a beat. Palpitations are usually not a serious problem. They may be caused by many things, including smoking, caffeine, alcohol, stress, and certain medicines. Although most causes of palpitations are not serious, palpitations can be a sign of a serious medical problem. In some cases, you may need further medical evaluation. Follow these instructions at home: Pay attention to any changes in your symptoms. Take these actions to help with your condition:  Avoid the following: ? Caffeinated coffee, tea, soft drinks, diet pills, and energy drinks. ? Chocolate. ? Alcohol.  Do not use any tobacco products, such as cigarettes, chewing tobacco, and e-cigarettes. If you need help quitting, ask your health care provider.  Try to reduce your stress and anxiety. Things that can help you relax include: ? Yoga. ? Meditation. ? Physical activity, such as swimming, jogging, or walking. ? Biofeedback. This is a method that helps you learn to use your mind to control things in your body, such as your heartbeats.  Get plenty of rest and sleep.  Take over-the-counter and prescription medicines only as told by your health care provider.  Keep all follow-up visits as told by your health care provider. This is important.  Contact a health care provider if:  You continue to have a fast or irregular heartbeat after 24 hours.  Your palpitations occur more often. Get help right away if:  You have chest pain or  shortness of breath.  You have a severe headache.  You feel dizzy or you faint. This information is not intended to replace advice given to you by your health care provider. Make sure you discuss any questions you have with your health care provider. Document Released: 05/24/2000 Document Revised: 10/30/2015 Document Reviewed: 02/09/2015 Elsevier Interactive Patient Education  [employer].

## 2017-12-22 NOTE — Telephone Encounter (Signed)
Noted  Will send nurse as Juluis Rainier

## 2017-12-22 NOTE — Telephone Encounter (Signed)
Pt reports palpitations x 1 week; not daily, vary in duration and frequency. H/O 'cardiac arrhythmia due to congenital heart disease' . States h/o similar symptoms "Years ago" and was given medication, unsure of name. Has not occurred since. Denies CP, SOB, dizziness, does not increase with exertion. States vary in duration and frequency, few seconds to minutes.  States has been under stress lately. States when palpitations occur, "I use techniques to get them to stop; control my breathing, calm myself down, no caffeine or alcohol." HR during call 60. Pt states 4 palpitations at that time "But first time since yesterday." States was working out in the yard, states was staying hydrated. Same day appt made with Dr. Regis Bill as no availability with Dr. Elease Hashimoto or C. Nafziger. Care advise given per protocol. Instructed to go to ED if palpitations increase in duration and/or frequency, dizziness or CP occur.   Reason for Disposition . Age > 60 years (Exception: brief heart beat symptoms that went away and now feels well)    Palpitations x 1 week.  Answer Assessment - Initial Assessment Questions 1. DESCRIPTION: "Please describe your heart rate or heart beat that you are having" (e.g., fast/slow, regular/irregular, skipped or extra beats, "palpitations")     Palpitations 2. ONSET: "When did it start?" (Minutes, hours or days)      1 week ago 3. DURATION: "How long does it last" (e.g., seconds, minutes, hours)     Varies, few seconds to longer, minutes 4. PATTERN "Does it come and go, or has it been constant since it started?"  "Does it get worse with exertion?"   "Are you feeling it now?"     Comes and goes; not worse with exertion 5. TAP: "Using your hand, can you tap out what you are feeling on a chair or table in front of you, so that I can hear?" (Note: not all patients can do this)        6. HEART RATE: "Can you tell me your heart rate?" "How many beats in 15 seconds?"  (Note: not all patients can do  this)       60 7. RECURRENT SYMPTOM: "Have you ever had this before?" If so, ask: "When was the last time?" and "What happened that time?"    Over a decade ago 8. CAUSE: "What do you think is causing the palpitations?"     unsure 9. CARDIAC HISTORY: "Do you have any history of heart disease?" (e.g., heart attack, angina, bypass surgery, angioplasty, arrhythmia)      H/O cardiac arrhythmia due to congenital heart disease 10. OTHER SYMPTOMS: "Do you have any other symptoms?" (e.g., dizziness, chest pain, sweating, difficulty breathing)       Sweats at night at times, under stress  Protocols used: [place]

## 2017-12-23 LAB — COMPREHENSIVE METABOLIC PANEL
ALBUMIN: 4.5 g/dL (ref 3.5–5.2)
ALK PHOS: 60 U/L (ref 39–117)
ALT: 11 U/L (ref 0–53)
AST: 17 U/L (ref 0–37)
BUN: 11 mg/dL (ref 6–23)
CHLORIDE: 101 meq/L (ref 96–112)
CO2: 32 mEq/L (ref 19–32)
Calcium: 9.5 mg/dL (ref 8.4–10.5)
Creatinine, Ser: 1 mg/dL (ref 0.40–1.50)
GFR: 77.33 mL/min (ref >60.00)
Glucose, Bld: 90 mg/dL (ref 70–99)
POTASSIUM: 4.4 meq/L (ref 3.5–5.1)
Sodium: 140 mEq/L (ref 135–145)
TOTAL PROTEIN: 6.7 g/dL (ref 6.0–8.3)
Total Bilirubin: 0.6 mg/dL (ref 0.2–1.2)

## 2017-12-23 LAB — CBC WITH DIFFERENTIAL/PLATELET
BASOS PCT: 1.3 % (ref 0.0–3.0)
Basophils Absolute: 0.1 10*3/uL (ref 0.0–0.1)
EOS PCT: 1.6 % (ref 0.0–5.0)
Eosinophils Absolute: 0.1 10*3/uL (ref 0.0–0.7)
HCT: 48.6 % (ref 39.0–52.0)
HEMOGLOBIN: 16.7 g/dL (ref 13.0–17.0)
LYMPHS ABS: 1.9 10*3/uL (ref 0.7–4.0)
Lymphocytes Relative: 32.5 % (ref 12.0–46.0)
MCHC: 34.5 g/dL (ref 30.0–36.0)
MCV: 92 fl (ref 78.0–100.0)
MONO ABS: 0.5 10*3/uL (ref 0.1–1.0)
MONOS PCT: 8.6 % (ref 3.0–12.0)
Neutro Abs: 3.3 10*3/uL (ref 1.4–7.7)
Neutrophils Relative %: 56 % (ref 43.0–77.0)
Platelets: 205 10*3/uL (ref 150.0–400.0)
RBC: 5.28 Mil/uL (ref 4.22–5.81)
RDW: 13.3 % (ref 11.5–15.5)
WBC: 6 10*3/uL (ref 4.0–10.5)

## 2017-12-23 LAB — TSH: TSH: 3.4 u[IU]/mL (ref 0.35–4.50)

## 2017-12-23 LAB — LIPID PANEL
Cholesterol: 232 mg/dL — ABNORMAL HIGH (ref 0–200)
HDL: 47.5 mg/dL (ref >39.00)
NONHDL: 184.27
TRIGLYCERIDES: 264 mg/dL — AB (ref 0.0–149.0)
Total CHOL/HDL Ratio: 5
VLDL: 52.8 mg/dL — ABNORMAL HIGH (ref 0.0–40.0)

## 2017-12-23 LAB — LDL CHOLESTEROL, DIRECT: LDL DIRECT: 157 mg/dL

## 2017-12-23 LAB — T4, FREE: Free T4: 0.83 ng/dL (ref 0.60–1.60)

## 2018-01-16 ENCOUNTER — Encounter: Payer: Self-pay | Admitting: Family Medicine

## 2018-01-16 ENCOUNTER — Ambulatory Visit (INDEPENDENT_AMBULATORY_CARE_PROVIDER_SITE_OTHER): Payer: Medicare Other | Admitting: Family Medicine

## 2018-01-16 VITALS — BP 130/64 | HR 68 | Temp 98.1°F | Wt 150.9 lb

## 2018-01-16 DIAGNOSIS — E785 Hyperlipidemia, unspecified: Secondary | ICD-10-CM

## 2018-01-16 DIAGNOSIS — Z800 Family history of malignant neoplasm of digestive organs: Secondary | ICD-10-CM | POA: Insufficient documentation

## 2018-01-16 DIAGNOSIS — R002 Palpitations: Secondary | ICD-10-CM | POA: Diagnosis not present

## 2018-01-16 NOTE — Progress Notes (Signed)
Subjective:     Patient ID: Steven Hopkins, male   DOB: 1940/11/16, 77 y.o.   MRN: [id number]  HPI Patient here to discuss several items as follows  History of intermittent palpitations. Refer to previous note. Patient thinks is stress related. TSH was normal. He states he's had some increased stress in looking at recent news events (eg TV). He went to his massage therapist who gave him some self relaxation breathing techniques which he thinks have helped. Minimal caffeine use. Minimal alcohol consumption. No chest pain. Patient currently doing well with minimal symptoms. Has never had an event monitor.  Patient questions need for repeat colonoscopy. He had last 5/09 which showed 1 benign polyp. Since that time however his mom was diagnosed with colon cancer. Patient denies any recent stool changes. Recent lab significant for cholesterol 232, triglycerides 264, HDL 47, LDL 157. No family history of CAD. Patient is nonsmoker. No history of diabetes.  Past Medical History:  Diagnosis Date  . Basal cell carcinoma   . Cardiac arrhythmia due to congenital heart disease   . Colon polyps   . History of chickenpox   . Hyperlipidemia   . Jaundice    Past Surgical History:  Procedure Laterality Date  . BASAL CELL CARCINOMA EXCISION  2008   left and right temple  . CHOLECYSTECTOMY    . detached vitreous    . HERNIA REPAIR    . TONSILLECTOMY      reports that he has never smoked. He has never used smokeless tobacco. He reports that he drinks alcohol. He reports that he does not use drugs. family history includes Arthritis in his unknown relative; Cancer in his father and mother; Depression in his unknown relative; Diabetes in his unknown relative; Stroke in his father. Allergies  Allergen Reactions  . Bactrim [Sulfamethoxazole-Trimethoprim] Rash      Review of Systems  Constitutional: Negative for fatigue.  Eyes: Negative for visual disturbance.  Respiratory: Negative for cough, chest  tightness and shortness of breath.   Cardiovascular: Negative for chest pain, palpitations and leg swelling.  Endocrine: Negative for polydipsia and polyuria.  Neurological: Negative for dizziness, syncope, weakness, light-headedness and headaches.       Objective:   Physical Exam  Constitutional: He is oriented to person, place, and time. He appears well-developed and well-nourished.  HENT:  Right Ear: External ear normal.  Left Ear: External ear normal.  Mouth/Throat: Oropharynx is clear and moist.  Eyes: Pupils are equal, round, and reactive to light.  Neck: Neck supple. No thyromegaly present.  Cardiovascular: Normal rate and regular rhythm.  Pulmonary/Chest: Effort normal and breath sounds normal. No respiratory distress. He has no wheezes. He has no rales.  Musculoskeletal: He exhibits no edema.  Neurological: He is alert and oriented to person, place, and time.       Assessment:     #1 history of palpitations currently stable. Suspect symptomatic PACs or PVCs  #2 dyslipidemia. 10 year risk for CAD event 26.9%  #3 positive family history of colon cancer. Patient's last colonoscopy 9 years ago    Plan:     -Continue to minimize caffeine use and avoid increased alcohol consumption -Discuss further evaluation with event monitor and/or echocardiogram and patient declines at this time. Be in touch if palpitations recur -Discuss lifestyle management of lipids. He is very reluctant to take medication such as statin at this time.  We reviewed current American Heart Association/ACC guidelines -Consider follow-up fasting lipids in about 6 months as  recent labs were non-fasting  Eulas Post MD [facility]

## 2018-01-16 NOTE — Patient Instructions (Signed)

## 2018-01-20 ENCOUNTER — Telehealth: Payer: Self-pay | Admitting: Family Medicine

## 2018-01-20 NOTE — Telephone Encounter (Signed)
Copied from [place] [id number]. Topic: General - Other >> Jan 20, 2018  9:44 AM Judyann Munson wrote: Reason for CRM: Patient is calling in regards to his referral,He had colon 9 yrs ago at [facility], Patient stated he is needing the  report  faxed over for review, before he can schedule appt. Best contact number for patient is [phone]. Please advise

## 2018-03-05 ENCOUNTER — Ambulatory Visit (INDEPENDENT_AMBULATORY_CARE_PROVIDER_SITE_OTHER): Payer: Medicare Other

## 2018-03-05 DIAGNOSIS — Z23 Encounter for immunization: Secondary | ICD-10-CM

## 2018-03-05 NOTE — Progress Notes (Signed)
Per orders of Dr.Burchette, injection of high Dose Flu given by Franco Collet. Patient tolerated injection well.

## 2018-03-18 ENCOUNTER — Encounter: Payer: Self-pay | Admitting: Family Medicine

## 2018-04-21 ENCOUNTER — Ambulatory Visit (INDEPENDENT_AMBULATORY_CARE_PROVIDER_SITE_OTHER): Payer: Medicare Other | Admitting: *Deleted

## 2018-04-21 DIAGNOSIS — Z23 Encounter for immunization: Secondary | ICD-10-CM

## 2018-04-21 NOTE — Progress Notes (Signed)
Per orders of Dr. Sherren Mocha, injection of oneumococcal given by Westley Hummer. Patient tolerated injection well.

## 2018-07-06 DIAGNOSIS — M5431 Sciatica, right side: Secondary | ICD-10-CM | POA: Diagnosis not present

## 2018-07-06 DIAGNOSIS — M9903 Segmental and somatic dysfunction of lumbar region: Secondary | ICD-10-CM | POA: Diagnosis not present

## 2018-07-09 DIAGNOSIS — M9903 Segmental and somatic dysfunction of lumbar region: Secondary | ICD-10-CM | POA: Diagnosis not present

## 2018-07-09 DIAGNOSIS — M5431 Sciatica, right side: Secondary | ICD-10-CM | POA: Diagnosis not present

## 2018-07-10 ENCOUNTER — Encounter: Payer: Self-pay | Admitting: Family Medicine

## 2018-07-10 ENCOUNTER — Ambulatory Visit (INDEPENDENT_AMBULATORY_CARE_PROVIDER_SITE_OTHER): Payer: Medicare Other | Admitting: Family Medicine

## 2018-07-10 ENCOUNTER — Other Ambulatory Visit: Payer: Self-pay

## 2018-07-10 VITALS — BP 120/80 | HR 65 | Temp 97.5°F | Wt 151.8 lb

## 2018-07-10 DIAGNOSIS — M545 Low back pain, unspecified: Secondary | ICD-10-CM

## 2018-07-10 LAB — POCT URINALYSIS DIPSTICK
BILIRUBIN UA: NEGATIVE
Blood, UA: NEGATIVE
Glucose, UA: NEGATIVE
Ketones, UA: NEGATIVE
Leukocytes, UA: NEGATIVE
NITRITE UA: NEGATIVE
PROTEIN UA: NEGATIVE
Spec Grav, UA: 1.015 (ref 1.010–1.025)
Urobilinogen, UA: 0.2 E.U./dL
pH, UA: 6 (ref 5.0–8.0)

## 2018-07-10 NOTE — Patient Instructions (Signed)
No evidence for urine or prostate infection.  Hope your back is feeling better soon.

## 2018-07-10 NOTE — Progress Notes (Signed)
  Subjective:     Patient ID: Steven Hopkins, male   DOB: 1941-02-08, 78 y.o.   MRN: [id number]  HPI Patient is seen with low back pain.  He has actually been seeing chiropractor and they referred him here yesterday.  Chiropractor had recommended that he get his prostate checked and urinalysis to rule out any kind of infection.  Patient has had some low back pain intermittently for a few months.  Couple of potential mild injuries at home.  No radiculitis symptoms.  Pain is lower lumbar area.  No frequent urination or burning with urination.  No history of acute or chronic prostatitis.  No fevers or chills.  Past Medical History:  Diagnosis Date  . Basal cell carcinoma   . Cardiac arrhythmia due to congenital heart disease   . Colon polyps   . History of chickenpox   . Hyperlipidemia   . Jaundice    Past Surgical History:  Procedure Laterality Date  . BASAL CELL CARCINOMA EXCISION  2008   left and right temple  . CHOLECYSTECTOMY    . detached vitreous    . HERNIA REPAIR    . TONSILLECTOMY      reports that he has never smoked. He has never used smokeless tobacco. He reports current alcohol use. He reports that he does not use drugs. family history includes Arthritis in an other family member; Cancer in his father and mother; Depression in an other family member; Diabetes in an other family member; Stroke in his father. Allergies  Allergen Reactions  . Bactrim [Sulfamethoxazole-Trimethoprim] Rash     Review of Systems  Constitutional: Negative for chills and fever.  Genitourinary: Negative for dysuria and hematuria.  Musculoskeletal: Positive for back pain.       Objective:   Physical Exam Constitutional:      Appearance: Normal appearance.  Genitourinary:    Comments: Rectal exam reveals normal-sized prostate.  Nontender.  No rectal masses Neurological:     Mental Status: He is alert.        Assessment:     Low back pain.  No evidence for urine or prostate  infection    Plan:     -Patient is reassured no signs of infection.  He will continue with chiropractor care  Eulas Post MD [facility]

## 2018-07-10 NOTE — Addendum Note (Signed)
Addended by: Anibal Henderson on: 07/10/2018 08:05 AM   Modules accepted: Orders

## 2018-07-13 DIAGNOSIS — M9903 Segmental and somatic dysfunction of lumbar region: Secondary | ICD-10-CM | POA: Diagnosis not present

## 2018-07-13 DIAGNOSIS — M5431 Sciatica, right side: Secondary | ICD-10-CM | POA: Diagnosis not present

## 2018-07-20 DIAGNOSIS — M5431 Sciatica, right side: Secondary | ICD-10-CM | POA: Diagnosis not present

## 2018-07-20 DIAGNOSIS — M9903 Segmental and somatic dysfunction of lumbar region: Secondary | ICD-10-CM | POA: Diagnosis not present

## 2018-08-31 DIAGNOSIS — L821 Other seborrheic keratosis: Secondary | ICD-10-CM | POA: Diagnosis not present

## 2018-08-31 DIAGNOSIS — L570 Actinic keratosis: Secondary | ICD-10-CM | POA: Diagnosis not present

## 2018-08-31 DIAGNOSIS — Z85828 Personal history of other malignant neoplasm of skin: Secondary | ICD-10-CM | POA: Diagnosis not present

## 2018-08-31 DIAGNOSIS — C4441 Basal cell carcinoma of skin of scalp and neck: Secondary | ICD-10-CM | POA: Diagnosis not present

## 2018-08-31 DIAGNOSIS — L738 Other specified follicular disorders: Secondary | ICD-10-CM | POA: Diagnosis not present

## 2019-02-18 ENCOUNTER — Ambulatory Visit (INDEPENDENT_AMBULATORY_CARE_PROVIDER_SITE_OTHER): Payer: Medicare Other

## 2019-02-18 ENCOUNTER — Other Ambulatory Visit: Payer: Self-pay

## 2019-02-18 DIAGNOSIS — Z23 Encounter for immunization: Secondary | ICD-10-CM | POA: Diagnosis not present

## 2019-02-18 NOTE — Patient Instructions (Signed)
Health Maintenance Due  Topic Date Due  . TETANUS/TDAP  07/16/1961  . INFLUENZA VACCINE  01/09/2019    Depression screen PHQ 2/9 05/09/2014  Decreased Interest 0  Down, Depressed, Hopeless 0  PHQ - 2 Score 0

## 2019-06-23 ENCOUNTER — Encounter: Payer: Self-pay | Admitting: Family Medicine
